# Patient Record
Sex: Female | Born: 1950 | Race: White | Hispanic: No | Marital: Single | Smoking: Current every day smoker
Health system: Southern US, Community
[De-identification: ages and names within clinical notes are randomized; demographics above are authoritative.]

## PROBLEM LIST (undated history)

## (undated) DIAGNOSIS — I10 Essential (primary) hypertension: Secondary | ICD-10-CM

## (undated) DIAGNOSIS — E7800 Pure hypercholesterolemia, unspecified: Secondary | ICD-10-CM

## (undated) DIAGNOSIS — I499 Cardiac arrhythmia, unspecified: Secondary | ICD-10-CM

## (undated) HISTORY — PX: APPENDECTOMY: SHX54

## (undated) HISTORY — PX: TUBAL LIGATION: SHX77

---

## 1999-05-25 ENCOUNTER — Other Ambulatory Visit: Admission: RE | Admit: 1999-05-25 | Discharge: 1999-05-25 | Payer: Self-pay | Admitting: Obstetrics and Gynecology

## 2000-09-20 ENCOUNTER — Other Ambulatory Visit: Admission: RE | Admit: 2000-09-20 | Discharge: 2000-09-20 | Payer: Self-pay | Admitting: Obstetrics and Gynecology

## 2001-11-05 ENCOUNTER — Other Ambulatory Visit: Admission: RE | Admit: 2001-11-05 | Discharge: 2001-11-05 | Payer: Self-pay | Admitting: Obstetrics and Gynecology

## 2004-12-05 ENCOUNTER — Other Ambulatory Visit: Admission: RE | Admit: 2004-12-05 | Discharge: 2004-12-05 | Payer: Self-pay | Admitting: Obstetrics and Gynecology

## 2013-04-25 DIAGNOSIS — M5137 Other intervertebral disc degeneration, lumbosacral region: Secondary | ICD-10-CM | POA: Insufficient documentation

## 2013-04-25 DIAGNOSIS — I10 Essential (primary) hypertension: Secondary | ICD-10-CM | POA: Insufficient documentation

## 2014-03-09 DIAGNOSIS — E785 Hyperlipidemia, unspecified: Secondary | ICD-10-CM | POA: Insufficient documentation

## 2014-03-29 ENCOUNTER — Emergency Department (HOSPITAL_COMMUNITY)
Admission: EM | Admit: 2014-03-29 | Discharge: 2014-03-29 | Disposition: A | Discharge status: Home / Self Care | Payer: Commercial Managed Care - PPO | Attending: Emergency Medicine | Admitting: Emergency Medicine

## 2014-03-29 ENCOUNTER — Encounter (HOSPITAL_COMMUNITY): Payer: Self-pay | Admitting: Emergency Medicine

## 2014-03-29 ENCOUNTER — Emergency Department (HOSPITAL_COMMUNITY): Payer: Commercial Managed Care - PPO

## 2014-03-29 DIAGNOSIS — R5381 Other malaise: Secondary | ICD-10-CM | POA: Insufficient documentation

## 2014-03-29 DIAGNOSIS — F17200 Nicotine dependence, unspecified, uncomplicated: Secondary | ICD-10-CM | POA: Insufficient documentation

## 2014-03-29 DIAGNOSIS — I472 Ventricular tachycardia, unspecified: Secondary | ICD-10-CM | POA: Insufficient documentation

## 2014-03-29 DIAGNOSIS — I10 Essential (primary) hypertension: Secondary | ICD-10-CM | POA: Insufficient documentation

## 2014-03-29 DIAGNOSIS — R51 Headache: Secondary | ICD-10-CM | POA: Insufficient documentation

## 2014-03-29 DIAGNOSIS — I4729 Other ventricular tachycardia: Secondary | ICD-10-CM | POA: Insufficient documentation

## 2014-03-29 DIAGNOSIS — R0602 Shortness of breath: Secondary | ICD-10-CM | POA: Insufficient documentation

## 2014-03-29 DIAGNOSIS — R5383 Other fatigue: Secondary | ICD-10-CM

## 2014-03-29 DIAGNOSIS — H539 Unspecified visual disturbance: Secondary | ICD-10-CM | POA: Insufficient documentation

## 2014-03-29 DIAGNOSIS — I471 Supraventricular tachycardia: Secondary | ICD-10-CM

## 2014-03-29 HISTORY — DX: Essential (primary) hypertension: I10

## 2014-03-29 HISTORY — DX: Pure hypercholesterolemia, unspecified: E78.00

## 2014-03-29 LAB — BASIC METABOLIC PANEL
BUN: 20 mg/dL (ref 6–23)
CO2: 21 meq/L (ref 19–32)
Calcium: 10.2 mg/dL (ref 8.4–10.5)
Chloride: 92 mEq/L — ABNORMAL LOW (ref 96–112)
Creatinine, Ser: 1.16 mg/dL — ABNORMAL HIGH (ref 0.50–1.10)
GFR calc Af Amer: 58 mL/min — ABNORMAL LOW (ref >90)
GFR, EST NON AFRICAN AMERICAN: 50 mL/min — AB (ref >90)
Glucose, Bld: 241 mg/dL — ABNORMAL HIGH (ref 70–99)
Potassium: 3.8 mEq/L (ref 3.7–5.3)
SODIUM: 130 meq/L — AB (ref 137–147)

## 2014-03-29 LAB — I-STAT TROPONIN, ED: Troponin i, poc: 0 ng/mL (ref 0.00–0.08)

## 2014-03-29 LAB — CBC
HCT: 43.4 % (ref 36.0–46.0)
HEMOGLOBIN: 15.6 g/dL — AB (ref 12.0–15.0)
MCH: 31.9 pg (ref 26.0–34.0)
MCHC: 35.9 g/dL (ref 30.0–36.0)
MCV: 88.8 fL (ref 78.0–100.0)
Platelets: 209 10*3/uL (ref 150–400)
RBC: 4.89 MIL/uL (ref 3.87–5.11)
RDW: 12 % (ref 11.5–15.5)
WBC: 12 10*3/uL — ABNORMAL HIGH (ref 4.0–10.5)

## 2014-03-29 MED ORDER — METOPROLOL TARTRATE 25 MG PO TABS: 25.0000 mg | ORAL_TABLET | Freq: Two times a day (BID) | ORAL | Status: DC

## 2014-03-29 MED ORDER — ADENOSINE 6 MG/2ML IV SOLN
INTRAVENOUS | Status: AC
  Administered 2014-03-29: 6 mg
  Filled 2014-03-29: qty 6

## 2014-03-29 MED ORDER — SODIUM CHLORIDE 0.9 % IV BOLUS (SEPSIS)
1000.0000 mL | Freq: Once | INTRAVENOUS | Status: AC
  Administered 2014-03-29: 1000 mL via INTRAVENOUS

## 2014-03-29 NOTE — ED Provider Notes (Signed)
CSN: [id number]     Arrival date & time 03/29/14  1701 History   First MD Initiated Contact with Patient 03/29/14 1736     Chief Complaint  Patient presents with  . Dizziness  . Shortness of Breath  . Headache     (Consider location/radiation/quality/duration/timing/severity/associated sxs/prior Treatment) HPI 63 year old female presents with about 5 hours of dizziness and weakness. She states this dizziness is similar to a lightheaded feeling. She has felt this way several times in the past for over a year but it does not usually last this long. She denies any palpitations, chest pain, or shortness of breath. She states today her vision has been intermittently blurry sometimes seeing floaters. She denies a feeling currently. She has a history of hypertension and hypercholesterolemia. She's not yet started her cholesterol medicine. She is currently on an ACE and HCTZ for her blood pressure. She denies any history of SVT. Patient states that she thinks she had some sort of heart problem over 20 years ago and had a procedure in her wrist to look at her arteries. However states her primary care physician did this and not a cardiologist. She denies a history of ablations or heart attacks.  Past Medical History  Diagnosis Date  . Hypertension   . High cholesterol    Past Surgical History  Procedure Laterality Date  . Appendectomy    . Tubal ligation     History reviewed. No pertinent family history. History  Substance Use Topics  . Smoking status: Current Every Day Smoker  . Smokeless tobacco: Not on file  . Alcohol Use: Yes   OB History   Grav Para Term Preterm Abortions TAB SAB Ect Mult Living                 Review of Systems  Eyes: Positive for visual disturbance.  Respiratory: Negative for shortness of breath.   Cardiovascular: Negative for chest pain, palpitations and leg swelling.  Gastrointestinal: Negative for vomiting.  Genitourinary: Negative for dysuria.   Neurological: Positive for dizziness, weakness and light-headedness. Negative for headaches.  All other systems reviewed and are negative.     Allergies  Sulfa antibiotics  Home Medications   Prior to Admission medications   Not on File   BP 128/101  Pulse 175  Temp(Src) 97.5 F (36.4 C) (Oral)  Resp 20  SpO2 96% Physical Exam  Nursing note and vitals reviewed. Constitutional: She is oriented to person, place, and time. She appears well-developed and well-nourished. No distress.  HENT:  Head: Normocephalic and atraumatic.  Right Ear: External ear normal.  Left Ear: External ear normal.  Nose: Nose normal.  Eyes: Right eye exhibits no discharge. Left eye exhibits no discharge.  Cardiovascular: Regular rhythm and normal heart sounds.  Tachycardia present.   Pulmonary/Chest: Effort normal and breath sounds normal.  Abdominal: Soft. She exhibits no distension. There is no tenderness.  Neurological: She is alert and oriented to person, place, and time.  Skin: Skin is warm and dry.    ED Course  CARDIOVERSION Date/Time: 03/29/2014 5:50 PM Performed by: Ephraim Hamburger Authorized by: Sherwood Gambler T Consent: Verbal consent obtained. Risks and benefits: risks, benefits and alternatives were discussed Consent given by: patient Patient sedated: no Cardioversion basis: emergent Pre-procedure rhythm: supraventricular tachycardia Patient position: patient was placed in a supine position Chest area: chest area exposed Number of attempts: 1 Post-procedure rhythm: sinus tachycardia. Complications: no complications Patient tolerance: Patient tolerated the procedure well with no immediate complications. Comments:  Converted to sinus tachycardia after 6 mg adenosine   (including critical care time) Labs Review Labs Reviewed  CBC - Abnormal; Notable for the following:    WBC 12.0 (*)    Hemoglobin 15.6 (*)    All other components within normal limits  BASIC METABOLIC  PANEL - Abnormal; Notable for the following:    Sodium 130 (*)    Chloride 92 (*)    Glucose, Bld 241 (*)    Creatinine, Ser 1.16 (*)    GFR calc non Af Amer 50 (*)    GFR calc Af Amer 58 (*)    All other components within normal limits  I-STAT TROPOININ, ED    Imaging Review Dg Chest Portable 1 View  03/29/2014   CLINICAL DATA:  Hypertension and history of tobacco use  EXAM: PORTABLE CHEST - 1 VIEW  COMPARISON:  None.  FINDINGS: The heart size and mediastinal contours are within normal limits. Both lungs are clear. The visualized skeletal structures are unremarkable.  IMPRESSION: No active disease.   Electronically Signed   By: Inez Catalina M.D.   On: 03/29/2014 18:34     EKG Interpretation   Date/Time:  Monday Mar 29 2014 17:17:26 EDT Ventricular Rate:  172 PR Interval:    QRS Duration: 90 QT Interval:  268 QTC Calculation: 453 R Axis:   66 Text Interpretation:  Supraventricular tachycardia Otherwise normal ECG No  old tracing to compare Confirmed by Malike Foglio  MD, Cornelis Kluver ([id number]) on  03/29/2014 5:26:22 PM      EKG #2  Date: 03/29/2014  Rate: 101  Rhythm: sinus tachycardia  QRS Axis: normal  Intervals: normal  ST/T Wave abnormalities: normal  Conduction Disutrbances:none  Narrative Interpretation: SVT resolved  Old EKG Reviewed: changes noted   MDM   Final diagnoses:  SVT (supraventricular tachycardia)    Patient feels completely normal after given adenosine. Her heart rate returned to normal and her rhythm converted to sinus. She's likely been SVT intermittently for several months given her symptoms. I discussed her case with the cardiologist on call, who recommends metoprolol 25 mg twice a day for rate control. He will help arrange for outpatient followup with a cardiologist. Patient not having any ischemic symptoms or any ischemia seen on EKG. This I feel she is safe for discharge, and counselor return precautions for both recurrent SVT and symptomatic adverse  reactions to the beta blocker.    Ephraim Hamburger, MD 03/29/14 [phone]

## 2014-03-29 NOTE — ED Notes (Signed)
Pt converted to sinus tachycardia rate of 107

## 2014-03-29 NOTE — ED Notes (Signed)
zoll pads in place.

## 2014-03-29 NOTE — ED Notes (Addendum)
Pt reports dizziness onset 2 hours ago. Pt with hx of same in past and given medication to get heart back in rhythm. Pt also complains of headache on and off when she has these spells. Headache centered to left side with blurred vision L>R.

## 2014-03-29 NOTE — ED Notes (Signed)
Per pt sts earlier today she began having dizziness, HA, and SOB. Pt in SVT at triage. Denies any chest pain.

## 2014-04-08 DIAGNOSIS — I471 Supraventricular tachycardia: Secondary | ICD-10-CM | POA: Insufficient documentation

## 2014-07-09 DIAGNOSIS — Z8639 Personal history of other endocrine, nutritional and metabolic disease: Secondary | ICD-10-CM | POA: Insufficient documentation

## 2014-07-09 DIAGNOSIS — E039 Hypothyroidism, unspecified: Secondary | ICD-10-CM | POA: Insufficient documentation

## 2014-09-15 DIAGNOSIS — M5416 Radiculopathy, lumbar region: Secondary | ICD-10-CM | POA: Insufficient documentation

## 2015-05-11 DIAGNOSIS — R631 Polydipsia: Secondary | ICD-10-CM | POA: Insufficient documentation

## 2015-05-11 DIAGNOSIS — R42 Dizziness and giddiness: Secondary | ICD-10-CM | POA: Insufficient documentation

## 2015-05-19 ENCOUNTER — Other Ambulatory Visit: Payer: Self-pay | Admitting: Family Medicine

## 2015-05-19 DIAGNOSIS — Z1231 Encounter for screening mammogram for malignant neoplasm of breast: Secondary | ICD-10-CM

## 2015-06-07 ENCOUNTER — Ambulatory Visit: Payer: Commercial Managed Care - PPO

## 2020-01-27 ENCOUNTER — Other Ambulatory Visit: Payer: Self-pay

## 2020-01-27 ENCOUNTER — Ambulatory Visit
Admission: EM | Admit: 2020-01-27 | Discharge: 2020-01-27 | Disposition: A | Discharge status: Home / Self Care | Payer: Medicare HMO | Attending: Internal Medicine | Admitting: Internal Medicine

## 2020-01-27 ENCOUNTER — Encounter: Payer: Self-pay | Admitting: Emergency Medicine

## 2020-01-27 DIAGNOSIS — M545 Low back pain, unspecified: Secondary | ICD-10-CM

## 2020-01-27 DIAGNOSIS — I10 Essential (primary) hypertension: Secondary | ICD-10-CM | POA: Diagnosis not present

## 2020-01-27 MED ORDER — METHYLPREDNISOLONE ACETATE 80 MG/ML IJ SUSP
80.0000 mg | Freq: Once | INTRAMUSCULAR | Status: AC
  Administered 2020-01-27: 80 mg via INTRAMUSCULAR

## 2020-01-27 MED ORDER — CYCLOBENZAPRINE HCL 5 MG PO TABS: 5.0000 mg | ORAL_TABLET | Freq: Two times a day (BID) | ORAL | 0 refills | Status: DC | PRN

## 2020-01-27 MED ORDER — DICLOFENAC SODIUM 50 MG PO TBEC: 50.0000 mg | DELAYED_RELEASE_TABLET | Freq: Two times a day (BID) | ORAL | 0 refills | Status: DC

## 2020-01-27 NOTE — Discharge Instructions (Signed)
We gave you an injection of Depo-Medrol today Continue with diclofenac twice daily, take with food no You may use flexeril as needed to help with pain. This is a muscle relaxer and causes sedation- please use only at bedtime or when you will be home and not have to drive/work Gentle stretching, avoid heavy lifting, avoid complete bedrest  Follow-up if not improving, worsening, developing any urinary symptoms or leg weakness

## 2020-01-27 NOTE — ED Notes (Signed)
Patient able to ambulate independently  

## 2020-01-27 NOTE — ED Provider Notes (Signed)
[facility]    CSN: [id number] Arrival date & time: 01/27/20  1625      History   Chief Complaint Chief Complaint  Patient presents with  . Back Pain    HPI Annette Kelley is a 69 y.o. female history of hypertension presenting today for evaluation of back pain.  Patient states that beginning Saturday she began to develop discomfort in her left lower back.  Worsened Sunday while she was on the bathroom straining and cough.  Pain has been spasming and worsening at times.  Does report some mild discomfort going into left leg.  Denies numbness or tingling.  Denies leg weakness.  Denies any urinary symptoms of dysuria, increased frequency or urgency.  Denies hematuria.  Denies history of kidney stones.  Denies any urinary incontinence.  Took some Tylenol approximately 30 minutes ago for symptoms.  Feels similar to prior back pain.  Denies any injury fall or trauma triggering symptoms.  Denies history of diabetes. HPI  Past Medical History:  Diagnosis Date  . High cholesterol   . Hypertension     There are no problems to display for this patient.   Past Surgical History:  Procedure Laterality Date  . APPENDECTOMY    . TUBAL LIGATION      OB History   No obstetric history on file.      Home Medications    Prior to Admission medications   Medication Sig Start Date End Date Taking? Authorizing Provider  aspirin EC 81 MG tablet Take 81 mg by mouth daily.    [provider]  cyclobenzaprine (FLEXERIL) 5 MG tablet Take 1-2 tablets (5-10 mg total) by mouth 2 (two) times daily as needed for muscle spasms. 01/27/20   Breahna Boylen C, PA-C  diclofenac (VOLTAREN) 50 MG EC tablet Take 1 tablet (50 mg total) by mouth 2 (two) times daily. 01/27/20   Jlyn Bracamonte C, PA-C  metoprolol (LOPRESSOR) 25 MG tablet Take 1 tablet (25 mg total) by mouth 2 (two) times daily. 03/29/14   Sherwood Gambler, MD  olmesartan-hydrochlorothiazide (BENICAR HCT) 40-25 MG per tablet Take  1 tablet by mouth daily.    [provider]    Family History Family History  Problem Relation Age of Onset  . Diabetes Mother   . Cancer Mother   . Hypertension Mother     Social History Social History   Tobacco Use  . Smoking status: Current Every Day Smoker    Packs/day: 1.00  . Smokeless tobacco: Never Used  Substance Use Topics  . Alcohol use: Yes  . Drug use: Not on file     Allergies   Sulfa antibiotics   Review of Systems Review of Systems  Constitutional: Negative for fatigue and fever.  Eyes: Negative for visual disturbance.  Respiratory: Negative for shortness of breath.   Cardiovascular: Negative for chest pain.  Gastrointestinal: Negative for abdominal pain, nausea and vomiting.  Genitourinary: Negative for decreased urine volume and difficulty urinating.  Musculoskeletal: Positive for back pain and myalgias. Negative for arthralgias and joint swelling.  Skin: Negative for color change, rash and wound.  Neurological: Negative for dizziness, weakness, light-headedness and headaches.     Physical Exam Triage Vital Signs ED Triage Vitals [01/27/20 1633]  Enc Vitals Group     BP (!) 179/78     Pulse Rate 86     Resp 18     Temp 98.2 F (36.8 C)     Temp Source Oral  SpO2 96 %     Weight      Height      Head Circumference      Peak Flow      Pain Score 10     Pain Loc      Pain Edu?      Excl. in [place]?    No data found.  Updated Vital Signs BP (!) 179/78 (BP Location: Left Arm)   Pulse 86   Temp 98.2 F (36.8 C) (Oral)   Resp 18   SpO2 96%   Visual Acuity Right Eye Distance:   Left Eye Distance:   Bilateral Distance:    Right Eye Near:   Left Eye Near:    Bilateral Near:     Physical Exam Vitals and nursing note reviewed.  Constitutional:      Appearance: She is well-developed.     Comments: No acute distress  HENT:     Head: Normocephalic and atraumatic.     Nose: Nose normal.  Eyes:     Conjunctiva/sclera:  Conjunctivae normal.  Cardiovascular:     Rate and Rhythm: Normal rate.  Pulmonary:     Effort: Pulmonary effort is normal. No respiratory distress.  Abdominal:     General: There is no distension.  Musculoskeletal:        General: Normal range of motion.     Cervical back: Neck supple.     Comments: Back: Nontender to palpation of thoracic, lumbar spine midline, no significant reproducible tenderness to palpation of left lower back, but points to this area as source of pain, ocassaionlly appears to have spasming/tightening of area  Hip strength 5/5 ankle bilaterally, patellar reflex 2+    Skin:    General: Skin is warm and dry.  Neurological:     Mental Status: She is alert and oriented to person, place, and time.      UC Treatments / Results  Labs (all labs ordered are listed, but only abnormal results are displayed) Labs Reviewed - No data to display  EKG   Radiology No results found.  Procedures Procedures (including critical care time)  Medications Ordered in UC Medications  methylPREDNISolone acetate (DEPO-MEDROL) injection 80 mg (has no administration in time range)    Initial Impression / Assessment and Plan / UC Course  I have reviewed the triage vital signs and the nursing notes.  Pertinent labs & imaging results that were available during my care of the patient were reviewed by me and considered in my medical decision making (see chart for details).     No mechanism of injury, do not suspect acute bony abnormality, no leg weakness, no red flags for cauda equina.  No urinary symptoms.  Most likely MSK cause.  Providing Depo-Medrol IM in clinic today, will continue on NSAIDs outpatient with diclofenac, Flexeril to help with spasming.  Discussed activity modification.  Discussed strict return precautions. Patient verbalized understanding and is agreeable with plan.  Final Clinical Impressions(s) / UC Diagnoses   Final diagnoses:  Acute left-sided low  back pain without sciatica     Discharge Instructions     We gave you an injection of Depo-Medrol today Continue with diclofenac twice daily, take with food no You may use flexeril as needed to help with pain. This is a muscle relaxer and causes sedation- please use only at bedtime or when you will be home and not have to drive/work Gentle stretching, avoid heavy lifting, avoid complete bedrest  Follow-up if not improving,  worsening, developing any urinary symptoms or leg weakness   ED Prescriptions    Medication Sig Dispense Auth. Provider   cyclobenzaprine (FLEXERIL) 5 MG tablet Take 1-2 tablets (5-10 mg total) by mouth 2 (two) times daily as needed for muscle spasms. 24 tablet Nakeya Adinolfi C, PA-C   diclofenac (VOLTAREN) 50 MG EC tablet Take 1 tablet (50 mg total) by mouth 2 (two) times daily. 30 tablet Toia Micale, [place] C, PA-C     PDMP not reviewed this encounter.   Janith Lima, PA-C 01/27/20 1704

## 2020-01-27 NOTE — ED Triage Notes (Signed)
Pt presents to [facility] for assessment of 3 days of left back pain (upper, mid and lower) without radiation to the leg.  Patient states hx of chronic back pain, and it was starting to bother her Saturday.  Sunday she went to have a bowel movement, coughed while sitting on the toilet, and the pain became much more difficult to bear.

## 2020-06-09 ENCOUNTER — Other Ambulatory Visit: Payer: Self-pay | Admitting: Family Medicine

## 2020-06-09 ENCOUNTER — Ambulatory Visit
Admission: RE | Admit: 2020-06-09 | Discharge: 2020-06-09 | Disposition: A | Discharge status: Home / Self Care | Payer: Medicare Other | Source: Ambulatory Visit | Attending: Family Medicine | Admitting: Family Medicine

## 2020-06-09 DIAGNOSIS — M25562 Pain in left knee: Secondary | ICD-10-CM

## 2020-08-05 DIAGNOSIS — E871 Hypo-osmolality and hyponatremia: Secondary | ICD-10-CM | POA: Insufficient documentation

## 2020-09-07 LAB — COLOGUARD: COLOGUARD: NEGATIVE

## 2021-03-30 NOTE — Progress Notes (Deleted)
Cardiology Office Note:    Date:  03/30/2021   ID:  Annette Kelley, DOB December 11, 1950, MRN [id number]  PCP:  Bartholome Bill, MD  Cardiologist:  None  Electrophysiologist:  None   Referring MD: Bartholome Bill, MD   No chief complaint on file. ***  History of Present Illness:    Annette Kelley is a 70 y.o. female with a hx of atrial fibrillation, hypertension, hyperlipidemia who is referred by Dr. Luciana Axe for evaluation of atrial fibrillation.  She was seen by Dr. Luciana Axe on 02/22/2021.  Found to be in new atrial fibrillation, rate 100s.  Past Medical History:  Diagnosis Date  . High cholesterol   . Hypertension     Past Surgical History:  Procedure Laterality Date  . APPENDECTOMY    . TUBAL LIGATION      Current Medications: No outpatient medications have been marked as taking for the 03/31/21 encounter (Appointment) with Donato Heinz, MD.     Allergies:   Sulfa antibiotics   Social History   Socioeconomic History  . Marital status: Single    Spouse name: Not on file  . Number of children: Not on file  . Years of education: Not on file  . Highest education level: Not on file  Occupational History  . Not on file  Tobacco Use  . Smoking status: Current Every Day Smoker    Packs/day: 1.00  . Smokeless tobacco: Never Used  Substance and Sexual Activity  . Alcohol use: Yes  . Drug use: Not on file  . Sexual activity: Not on file  Other Topics Concern  . Not on file  Social History Narrative  . Not on file   Social Determinants of Health   Financial Resource Strain: Not on file  Food Insecurity: Not on file  Transportation Needs: Not on file  Physical Activity: Not on file  Stress: Not on file  Social Connections: Not on file     Family History: The patient's ***family history includes Cancer in her mother; Diabetes in her mother; Hypertension in her mother.  ROS:   Please see the history of present illness.    *** All other systems  reviewed and are negative.  EKGs/Labs/Other Studies Reviewed:    The following studies were reviewed today: ***  EKG:  EKG is *** ordered today.  The ekg ordered today demonstrates ***  Recent Labs: No results found for requested labs within last 8760 hours.  Recent Lipid Panel No results found for: CHOL, TRIG, HDL, CHOLHDL, VLDL, LDLCALC, LDLDIRECT  Physical Exam:    VS:  There were no vitals taken for this visit.    Wt Readings from Last 3 Encounters:  No data found for Wt     GEN: *** Well nourished, well developed in no acute distress HEENT: Normal NECK: No JVD; No carotid bruits LYMPHATICS: No lymphadenopathy CARDIAC: ***RRR, no murmurs, rubs, gallops RESPIRATORY:  Clear to auscultation without rales, wheezing or rhonchi  ABDOMEN: Soft, non-tender, non-distended MUSCULOSKELETAL:  No edema; No deformity  SKIN: Warm and dry NEUROLOGIC:  Alert and oriented x 3 PSYCHIATRIC:  Normal affect   ASSESSMENT:    No diagnosis found. PLAN:    Atrial fibrillation:  Hypertension: On hydrochlorothiazide 25 mg daily, Toprol XL 25 mg daily, telmisartan 80 mg daily  Hyperlipidemia: On atorvastatin 20 mg daily  RTC in***  Medication Adjustments/Labs and Tests Ordered: Current medicines are reviewed at length with the patient today.  Concerns regarding medicines are outlined above.  No orders of the defined types were placed in this encounter.  No orders of the defined types were placed in this encounter.   There are no Patient Instructions on file for this visit.   Signed, Donato Heinz, MD  03/30/2021 10:19 PM    [facility]

## 2021-03-31 ENCOUNTER — Ambulatory Visit: Payer: Medicare Other | Admitting: Cardiology

## 2021-03-31 ENCOUNTER — Encounter: Payer: Self-pay | Admitting: Cardiology

## 2021-03-31 ENCOUNTER — Other Ambulatory Visit: Payer: Self-pay

## 2021-03-31 VITALS — BP 144/92 | HR 99 | Ht 63.0 in | Wt 144.0 lb

## 2021-03-31 DIAGNOSIS — E785 Hyperlipidemia, unspecified: Secondary | ICD-10-CM | POA: Diagnosis not present

## 2021-03-31 DIAGNOSIS — I10 Essential (primary) hypertension: Secondary | ICD-10-CM

## 2021-03-31 DIAGNOSIS — I4891 Unspecified atrial fibrillation: Secondary | ICD-10-CM

## 2021-03-31 DIAGNOSIS — R0683 Snoring: Secondary | ICD-10-CM | POA: Diagnosis not present

## 2021-03-31 MED ORDER — ELIQUIS 5 MG PO TABS
5.0000 mg | ORAL_TABLET | Freq: Two times a day (BID) | ORAL | 0 refills | Status: DC
Start: 2021-03-31 — End: 2021-05-08

## 2021-03-31 NOTE — Progress Notes (Signed)
Cardiology Office Note:    Date:  03/31/2021   ID:  Annette Kelley, DOB 1951-08-09, MRN [id number]  PCP:  Bartholome Bill, MD  Cardiologist:  None  Electrophysiologist:  None   Referring MD: Bartholome Bill, MD   Chief Complaint  Patient presents with  . New Patient (Initial Visit)  . Atrial Fibrillation    New onset.    History of Present Illness:    Annette Kelley is a 70 y.o. female with a hx of atrial fibrillation, hypertension, hyperlipidemia who is referred by Dr. Luciana Axe for evaluation of atrial fibrillation.  She was seen by Dr. Luciana Axe on 02/22/2021.  Found to be in new atrial fibrillation, rate 100s.  Today, she is doing well overall, without new complaints at this time. Currently she has been taking 1 metoprolol in the morning, and was unaware she needed to take it twice a day.  For activity, she enjoys playing golf (ride and walk the course), and also cleans the house, and does not notice any exertional symptoms. However, she does not enjoy walking for formal exercise. When she is very fatigued she has been known to snore, but is relieved by propping up her head. She has not participated in a sleep study. She denies any chest pain, shortness of breath, or palpitations. No pre-syncope, syncope, or lightheadedness to note. Also has no lower extremity edema, orthopnea or PND. Typically she smokes 1 ppd. Previously, she quit smoking for a period of 12 years. At this time, she does not wish to quit smoking as it is a stress reliever for her. She confirms a history of hypertension. Two of her brothers have Afib. Her father had a cerebral stroke around 40-50 yo. Her mother had a stroke in her late 46s, and had a pacemaker. She is unsure if her parents had Afib.    Past Medical History:  Diagnosis Date  . High cholesterol   . Hypertension     Past Surgical History:  Procedure Laterality Date  . APPENDECTOMY    . TUBAL LIGATION      Current Medications: Current Meds   Medication Sig  . apixaban (ELIQUIS) 5 MG TABS tablet Take 1 tablet (5 mg total) by mouth 2 (two) times daily.  Marland Kitchen atorvastatin (LIPITOR) 20 MG tablet Take 1 tablet by mouth daily.  . cyclobenzaprine (FLEXERIL) 5 MG tablet Take 1-2 tablets (5-10 mg total) by mouth 2 (two) times daily as needed for muscle spasms.  . diclofenac (VOLTAREN) 50 MG EC tablet Take 1 tablet (50 mg total) by mouth 2 (two) times daily.  . meloxicam (MOBIC) 7.5 MG tablet Take 1 tablet by mouth daily.  . metoprolol (LOPRESSOR) 25 MG tablet Take 1 tablet (25 mg total) by mouth 2 (two) times daily.  Marland Kitchen olmesartan-hydrochlorothiazide (BENICAR HCT) 40-25 MG per tablet Take 1 tablet by mouth daily.  . [DISCONTINUED] aspirin EC 81 MG tablet Take 162 mg by mouth daily.     Allergies:   Sulfa antibiotics   Social History   Socioeconomic History  . Marital status: Single    Spouse name: Not on file  . Number of children: Not on file  . Years of education: Not on file  . Highest education level: Not on file  Occupational History  . Not on file  Tobacco Use  . Smoking status: Current Every Day Smoker    Packs/day: 1.00  . Smokeless tobacco: Never Used  Substance and Sexual Activity  . Alcohol use: Yes  .  Drug use: Not on file  . Sexual activity: Not on file  Other Topics Concern  . Not on file  Social History Narrative  . Not on file   Social Determinants of Health   Financial Resource Strain: Not on file  Food Insecurity: Not on file  Transportation Needs: Not on file  Physical Activity: Not on file  Stress: Not on file  Social Connections: Not on file     Family History: The patient's family history includes Cancer in her mother; Diabetes in her mother; Hypertension in her mother.  ROS:   Please see the history of present illness.    (+) Snores All other systems reviewed and are negative.  EKGs/Labs/Other Studies Reviewed:    The following studies were reviewed today:   EKG:   03/31/2021: Atrial  fibrillation, rate 99 bpm, No ST abnormalities  Recent Labs: No results found for requested labs within last 8760 hours.  Recent Lipid Panel No results found for: CHOL, TRIG, HDL, CHOLHDL, VLDL, LDLCALC, LDLDIRECT  Physical Exam:    VS:  BP (!) 144/92 (BP Location: Left Arm, Patient Position: Sitting, Cuff Size: Normal)   Pulse 99   Ht 5\' 3"  (1.6 m)   Wt 144 lb (65.3 kg)   BMI 25.51 kg/m     Wt Readings from Last 3 Encounters:  03/31/21 144 lb (65.3 kg)     GEN: Well nourished, well developed in no acute distress HEENT: Normal NECK: No JVD; No carotid bruits LYMPHATICS: No lymphadenopathy CARDIAC: Irregular rhythm, normal rate, no murmurs, rubs, gallops RESPIRATORY:  Clear to auscultation without rales, wheezing or rhonchi  ABDOMEN: Soft, non-tender, non-distended MUSCULOSKELETAL:  No edema; No deformity  SKIN: Warm and dry NEUROLOGIC:  Alert and oriented x 3 PSYCHIATRIC:  Normal affect   ASSESSMENT:    1. Atrial fibrillation, unspecified type ([place])   2. Essential hypertension   3. Hyperlipidemia, unspecified hyperlipidemia type   4. Snoring    PLAN:    Atrial fibrillation: New diagnosis.  CHA2DS2-VASc score 3 (hypertension, age, female). -Rates appear reasonably controlled but she is only taking Lopressor once daily.  Advised to take Lopressor 25 mg twice daily -Start Eliquis 5 mg twice daily.  Stop daily aspirin.  Labs at [facility] on 02/23/2021 showed hemoglobin 16.9, creatinine 0.9 -Echocardiogram -Sleep study -Schedule follow-up in 3 weeks.  If remains in A. fib, will schedule cardioversion  Hypertension: On hydrochlorothiazide 25 mg daily,  telmisartan 80 mg daily.  Only taking metoprolol once daily as above, will increase to twice daily  Hyperlipidemia: On atorvastatin 20 mg daily. LDL 70 on 02/23/2021  Snoring: will check sleep study given new A. fib as above  RTC in 3 weeks  Medication Adjustments/Labs and Tests Ordered: Current medicines are reviewed  at length with the patient today.  Concerns regarding medicines are outlined above.  Orders Placed This Encounter  Procedures  . EKG 12-Lead  . ECHOCARDIOGRAM COMPLETE  . Split night study   Meds ordered this encounter  Medications  . apixaban (ELIQUIS) 5 MG TABS tablet    Sig: Take 1 tablet (5 mg total) by mouth 2 (two) times daily.    Dispense:  60 tablet    Refill:  0    30 day free card    Patient Instructions  Medication Instructions:  STOP aspirin INCREASE metoprolol tartrate (Lopressor) to 25 mg two times daily START Eliquis 5 mg two times daily  *If you need a refill on your cardiac medications before your  next appointment, please call your pharmacy*  Testing/Procedures: Your physician has requested that you have an echocardiogram. Echocardiography is a painless test that uses sound waves to create images of your heart. It provides your doctor with information about the size and shape of your heart and how well your heart's chambers and valves are working. This procedure takes approximately one hour. There are no restrictions for this procedure.  This will be done at our [facility] location:  [place] has recommended that you have a sleep study. This test records several body functions during sleep, including: brain activity, eye movement, oxygen and carbon dioxide blood levels, heart rate and rhythm, breathing rate and rhythm, the flow of air through your mouth and nose, snoring, body muscle movements, and chest and belly movement.  Follow-Up: At [facility], you and your health needs are our priority.  As part of our continuing mission to provide you with exceptional heart care, we have created designated Provider Care Teams.  These Care Teams include your primary Cardiologist (physician) and Advanced Practice Providers (APPs -  Physician Assistants and Nurse Practitioners) who all work together to provide you with the care you  need, when you need it.  We recommend signing up for the patient portal called "MyChart".  Sign up information is provided on this After Visit Summary.  MyChart is used to connect with patients for Virtual Visits (Telemedicine).  Patients are able to view lab/test results, encounter notes, upcoming appointments, etc.  Non-urgent messages can be sent to your provider as well.   To learn more about what you can do with MyChart, go to [email or web address].    Your next appointment:   3 week(s)  The format for your next appointment:   In Person  Provider:   Oswaldo Milian, MD        [facility] Stumpf,acting as a scribe for Donato Heinz, MD.,have documented all relevant documentation on the behalf of Donato Heinz, MD,as directed by  Donato Heinz, MD while in the presence of Donato Heinz, MD.  I, Donato Heinz, MD, have reviewed all documentation for this visit. The documentation on 03/31/21 for the exam, diagnosis, procedures, and orders are all accurate and complete.   Signed, Donato Heinz, MD  03/31/2021 6:00 PM    [place]

## 2021-03-31 NOTE — Patient Instructions (Signed)
Medication Instructions:  STOP aspirin INCREASE metoprolol tartrate (Lopressor) to 25 mg two times daily START Eliquis 5 mg two times daily  *If you need a refill on your cardiac medications before your next appointment, please call your pharmacy*  Testing/Procedures: Your physician has requested that you have an echocardiogram. Echocardiography is a painless test that uses sound waves to create images of your heart. It provides your doctor with information about the size and shape of your heart and how well your heart's chambers and valves are working. This procedure takes approximately one hour. There are no restrictions for this procedure.  This will be done at our [facility] location:  [place] has recommended that you have a sleep study. This test records several body functions during sleep, including: brain activity, eye movement, oxygen and carbon dioxide blood levels, heart rate and rhythm, breathing rate and rhythm, the flow of air through your mouth and nose, snoring, body muscle movements, and chest and belly movement.  Follow-Up: At [facility], you and your health needs are our priority.  As part of our continuing mission to provide you with exceptional heart care, we have created designated Provider Care Teams.  These Care Teams include your primary Cardiologist (physician) and Advanced Practice Providers (APPs -  Physician Assistants and Nurse Practitioners) who all work together to provide you with the care you need, when you need it.  We recommend signing up for the patient portal called "MyChart".  Sign up information is provided on this After Visit Summary.  MyChart is used to connect with patients for Virtual Visits (Telemedicine).  Patients are able to view lab/test results, encounter notes, upcoming appointments, etc.  Non-urgent messages can be sent to your provider as well.   To learn more about what you can do with MyChart, go to  [email or web address].    Your next appointment:   3 week(s)  The format for your next appointment:   In Person  Provider:   Oswaldo Milian, MD

## 2021-04-25 ENCOUNTER — Other Ambulatory Visit: Payer: Self-pay

## 2021-04-25 ENCOUNTER — Ambulatory Visit (HOSPITAL_COMMUNITY): Payer: Medicare Other | Attending: Internal Medicine

## 2021-04-25 ENCOUNTER — Ambulatory Visit: Payer: Medicare Other | Admitting: Cardiology

## 2021-04-25 ENCOUNTER — Encounter: Payer: Self-pay | Admitting: Cardiology

## 2021-04-25 DIAGNOSIS — I4819 Other persistent atrial fibrillation: Secondary | ICD-10-CM

## 2021-04-25 DIAGNOSIS — I4891 Unspecified atrial fibrillation: Secondary | ICD-10-CM

## 2021-04-25 DIAGNOSIS — E785 Hyperlipidemia, unspecified: Secondary | ICD-10-CM | POA: Diagnosis not present

## 2021-04-25 DIAGNOSIS — R0683 Snoring: Secondary | ICD-10-CM | POA: Diagnosis not present

## 2021-04-25 DIAGNOSIS — I10 Essential (primary) hypertension: Secondary | ICD-10-CM

## 2021-04-25 LAB — ECHOCARDIOGRAM COMPLETE: S' Lateral: 2.5 cm

## 2021-04-25 NOTE — Patient Instructions (Addendum)
**  Please call when you get home to clarify correct medication**  You are scheduled for a Cardioversion on Wednesday 6/22 with Dr. Harrell Gave.  Please arrive at the [facility] (Main Entrance A) at [facility]: [address] [place], [place] [address] at 10:30 AM.  DIET: Nothing to eat or drink after midnight except a sip of water with medications (see medication instructions below)  FYI: For your safety, and to allow [place] to monitor your vital signs accurately during the surgery/procedure we request that   if you have artificial nails, gel coating, SNS etc. Please have those removed prior to your surgery/procedure. Not having the nail coverings /polish removed may result in cancellation or delay of your surgery/procedure.   Medication Instructions:  Continue your anticoagulant: Eliquis You will need to continue your anticoagulant after  your procedure until you are told by your  Provider that it is safe to stop  Labs: 1 week prior to procedure  (BMET, CBC)  Our in office lab hours are Monday-Friday 8:00-4:00, closed for lunch 12:45-1:45 pm.  No appointment needed.  You must have a responsible person to drive you home and stay in the waiting area during your procedure. Failure to do so could result in cancellation.  Bring your insurance cards.  *Special Note: Every effort is made to have your procedure done on time. Occasionally there are emergencies that occur at the hospital that may cause delays. Please be patient if a delay does occur.     FOLLOW UP: Wednesday, 7/20 at 12pm with Dr. Gardiner Rhyme

## 2021-04-25 NOTE — Progress Notes (Deleted)
Cardiology Office Note:    Date:  04/25/2021   ID:  Annette Kelley, DOB Jul 14, 1951, MRN [id number]  PCP:  Bartholome Bill, MD  Cardiologist:  None  Electrophysiologist:  None   Referring MD: Bartholome Bill, MD   No chief complaint on file.   History of Present Illness:    Annette Kelley is a 70 y.o. female with a hx of atrial fibrillation, hypertension, hyperlipidemia who presents for follow-up.  She was referred by Dr. Luciana Axe for evaluation of atrial fibrillation, initially seen on 03/31/2021.  She was seen by Dr. Luciana Axe on 02/22/2021.  Found to be in new atrial fibrillation, rate 100s.  Today, she is doing well overall, without new complaints at this time. Currently she has been taking 1 metoprolol in the morning, and was unaware she needed to take it twice a day.  For activity, she enjoys playing golf (ride and walk the course), and also cleans the house, and does not notice any exertional symptoms. However, she does not enjoy walking for formal exercise. When she is very fatigued she has been known to snore, but is relieved by propping up her head. She has not participated in a sleep study. She denies any chest pain, shortness of breath, or palpitations. No pre-syncope, syncope, or lightheadedness to note. Also has no lower extremity edema, orthopnea or PND. Typically she smokes 1 ppd. Previously, she quit smoking for a period of 12 years. At this time, she does not wish to quit smoking as it is a stress reliever for her. She confirms a history of hypertension. Two of her brothers have Afib. Her father had a cerebral stroke around 3-50 yo. Her mother had a stroke in her late 77s, and had a pacemaker. She is unsure if her parents had Afib.    Past Medical History:  Diagnosis Date  . High cholesterol   . Hypertension     Past Surgical History:  Procedure Laterality Date  . APPENDECTOMY    . TUBAL LIGATION      Current Medications: Current Meds  Medication Sig  . apixaban  (ELIQUIS) 5 MG TABS tablet Take 1 tablet (5 mg total) by mouth 2 (two) times daily.  Marland Kitchen atorvastatin (LIPITOR) 20 MG tablet Take 1 tablet by mouth daily.  . cyclobenzaprine (FLEXERIL) 5 MG tablet Take 1-2 tablets (5-10 mg total) by mouth 2 (two) times daily as needed for muscle spasms.  . diclofenac (VOLTAREN) 50 MG EC tablet Take 1 tablet (50 mg total) by mouth 2 (two) times daily.  Marland Kitchen losartan-hydrochlorothiazide (HYZAAR) 100-12.5 MG tablet Take 1 tablet by mouth daily.  . meloxicam (MOBIC) 7.5 MG tablet Take 1 tablet by mouth daily.  . metoprolol (LOPRESSOR) 25 MG tablet Take 1 tablet (25 mg total) by mouth 2 (two) times daily.  Marland Kitchen olmesartan-hydrochlorothiazide (BENICAR HCT) 40-25 MG per tablet Take 1 tablet by mouth daily.     Allergies:   Sulfa antibiotics   Social History   Socioeconomic History  . Marital status: Single    Spouse name: Not on file  . Number of children: Not on file  . Years of education: Not on file  . Highest education level: Not on file  Occupational History  . Not on file  Tobacco Use  . Smoking status: Current Every Day Smoker    Packs/day: 1.00  . Smokeless tobacco: Never Used  Substance and Sexual Activity  . Alcohol use: Yes  . Drug use: Not on file  . Sexual  activity: Not on file  Other Topics Concern  . Not on file  Social History Narrative  . Not on file   Social Determinants of Health   Financial Resource Strain: Not on file  Food Insecurity: Not on file  Transportation Needs: Not on file  Physical Activity: Not on file  Stress: Not on file  Social Connections: Not on file     Family History: The patient's family history includes Cancer in her mother; Diabetes in her mother; Hypertension in her mother.  ROS:   Please see the history of present illness.    (+) Snores All other systems reviewed and are negative.  EKGs/Labs/Other Studies Reviewed:    The following studies were reviewed today:   EKG:   03/31/2021: Atrial  fibrillation, rate 99 bpm, No ST abnormalities  Recent Labs: No results found for requested labs within last 8760 hours.  Recent Lipid Panel No results found for: CHOL, TRIG, HDL, CHOLHDL, VLDL, LDLCALC, LDLDIRECT  Physical Exam:    VS:  BP 132/80   Pulse 90   Ht 5\' 3"  (1.6 m)   Wt 140 lb (63.5 kg)   SpO2 99%   BMI 24.80 kg/m     Wt Readings from Last 3 Encounters:  04/25/21 140 lb (63.5 kg)  03/31/21 144 lb (65.3 kg)     GEN: Well nourished, well developed in no acute distress HEENT: Normal NECK: No JVD; No carotid bruits LYMPHATICS: No lymphadenopathy CARDIAC: Irregular rhythm, normal rate, no murmurs, rubs, gallops RESPIRATORY:  Clear to auscultation without rales, wheezing or rhonchi  ABDOMEN: Soft, non-tender, non-distended MUSCULOSKELETAL:  No edema; No deformity  SKIN: Warm and dry NEUROLOGIC:  Alert and oriented x 3 PSYCHIATRIC:  Normal affect   ASSESSMENT:    1. Persistent atrial fibrillation ([place])   2. Essential hypertension   3. Hyperlipidemia, unspecified hyperlipidemia type   4. Snoring    PLAN:    Persistent atrial fibrillation: New diagnosis.  CHA2DS2-VASc score 3 (hypertension, age, female). -Rates appear controlled, continue metoprolol 25 mg twice daily -Continue Eliquis 5 mg twice daily -Echocardiogram pending -Sleep study -Recommend cardioversion to restore sinus rhythm given new onset A. fib, denies any missed Eliquis doses in the last 3 weeks, will schedule cardioversion  Hypertension: On hydrochlorothiazide 25 mg daily and metoprolol 25 mg BID and thinks she is taking lisinopril but is not sure.  Asked her to check her meds and let us know what she is taking.  Hyperlipidemia: On atorvastatin 20 mg daily. LDL 70 on 02/23/2021  Snoring: will check sleep study given new A. fib as above  RTC in 6 weeks  Medication Adjustments/Labs and Tests Ordered: Current medicines are reviewed at length with the patient today.  Concerns regarding medicines  are outlined above.  No orders of the defined types were placed in this encounter.  No orders of the defined types were placed in this encounter.   There are no Patient Instructions on file for this visit.   I,Mathew Stumpf,acting as a Education administrator for Donato Heinz, MD.,have documented all relevant documentation on the behalf of Donato Heinz, MD,as directed by  Donato Heinz, MD while in the presence of Donato Heinz, MD.  I, Donato Heinz, MD, have reviewed all documentation for this visit. The documentation on 04/25/21 for the exam, diagnosis, procedures, and orders are all accurate and complete.   Signed, Donato Heinz, MD  04/25/2021 2:38 PM    [place]

## 2021-04-25 NOTE — Progress Notes (Addendum)
Cardiology Office Note:    Date:  04/25/2021   ID:  Annette Kelley, DOB [birth year], MRN [id number]  PCP:  Bartholome Bill, MD  Cardiologist:  None  Electrophysiologist:  None   Referring MD: Bartholome Bill, MD   Chief Complaint  Patient presents with   Atrial Fibrillation    History of Present Illness:    Annette Kelley is a 70 y.o. female with a hx of atrial fibrillation, hypertension, hyperlipidemia who presents for follow-up.  She was referred by Dr. Luciana Axe for evaluation of atrial fibrillation, initially seen on 03/31/2021.  She was seen by Dr. Luciana Axe on 02/22/2021.  Found to be in new atrial fibrillation, rate 100s.  At her previous visit, she was doing well overall, without new complaints at this time. Currently she has been taking 1 metoprolol in the morning, and was unaware she needed to take it twice a day.  For activity, she enjoys playing golf (ride and walk the course), and also cleans the house, and does not notice any exertional symptoms. However, she does not enjoy walking for formal exercise. When she is very fatigued she has been known to snore, but is relieved by propping up her head. She has not participated in a sleep study. She denies any chest pain, shortness of breath, or palpitations. No pre-syncope, syncope, or lightheadedness to note. Also has no lower extremity edema, orthopnea or PND. Typically she smokes 1 ppd. Previously, she quit smoking for a period of 12 years. At this time, she does not wish to quit smoking as it is a stress reliever for her. She confirms a history of hypertension. Two of her brothers have Afib. Her father had a cerebral stroke around 77-50 yo. Her mother had a stroke in her late 84s, and had a pacemaker. She is unsure if her parents had Afib.   Since last clinic visit, she has not had any chest pain or palpitations. She appears well. She remains compliant with Eliquis and Metoprolol and has not missed a single dose. In the past two weeks,  she only used meloxicam once. She denies any shortness of breath, or exertional symptoms. No headaches, lightheadedness, or syncope to report. Also has no lower extremity edema, orthopnea or PND. Recently, her brother passed away and she needs to defer an ablation for a few weeks.   Past Medical History:  Diagnosis Date   High cholesterol    Hypertension     Past Surgical History:  Procedure Laterality Date   APPENDECTOMY     TUBAL LIGATION      Current Medications: Current Meds  Medication Sig   apixaban (ELIQUIS) 5 MG TABS tablet Take 1 tablet (5 mg total) by mouth 2 (two) times daily.   atorvastatin (LIPITOR) 20 MG tablet Take 1 tablet by mouth daily.   cyclobenzaprine (FLEXERIL) 5 MG tablet Take 1-2 tablets (5-10 mg total) by mouth 2 (two) times daily as needed for muscle spasms.   diclofenac (VOLTAREN) 50 MG EC tablet Take 1 tablet (50 mg total) by mouth 2 (two) times daily.   losartan-hydrochlorothiazide (HYZAAR) 100-12.5 MG tablet Take 1 tablet by mouth daily.   meloxicam (MOBIC) 7.5 MG tablet Take 1 tablet by mouth daily.   metoprolol (LOPRESSOR) 25 MG tablet Take 1 tablet (25 mg total) by mouth 2 (two) times daily.   olmesartan-hydrochlorothiazide (BENICAR HCT) 40-25 MG per tablet Take 1 tablet by mouth daily.     Allergies:   Sulfa antibiotics   Social History  Socioeconomic History   Marital status: Single    Spouse name: Not on file   Number of children: Not on file   Years of education: Not on file   Highest education level: Not on file  Occupational History   Not on file  Tobacco Use   Smoking status: Current Every Day Smoker    Packs/day: 1.00   Smokeless tobacco: Never Used  Substance and Sexual Activity   Alcohol use: Yes   Drug use: Not on file   Sexual activity: Not on file  Other Topics Concern   Not on file  Social History Narrative   Not on file   Social Determinants of Health   Financial Resource Strain: Not on file  Food Insecurity: Not  on file  Transportation Needs: Not on file  Physical Activity: Not on file  Stress: Not on file  Social Connections: Not on file     Family History: The patient's family history includes Cancer in her mother; Diabetes in her mother; Hypertension in her mother.  ROS:   Please see the history of present illness.     All other systems reviewed and are negative.  EKGs/Labs/Other Studies Reviewed:    The following studies were reviewed today:   EKG:   04/25/2021: Atrial Fibrillation, rate 90, poor R wave progression 03/31/2021: Atrial fibrillation, rate 99 bpm, No ST abnormalities  Recent Labs: No results found for requested labs within last 8760 hours.  Recent Lipid Panel No results found for: CHOL, TRIG, HDL, CHOLHDL, VLDL, LDLCALC, LDLDIRECT  Physical Exam:    VS:  BP 132/80   Pulse 90   Ht 5\' 3"  (1.6 m)   Wt 140 lb (63.5 kg)   SpO2 99%   BMI 24.80 kg/m     Wt Readings from Last 3 Encounters:  04/25/21 140 lb (63.5 kg)  03/31/21 144 lb (65.3 kg)     GEN: Well nourished, well developed in no acute distress HEENT: Normal NECK: No JVD; No carotid bruits LYMPHATICS: No lymphadenopathy CARDIAC: Irregular rhythm, normal rate, no murmurs, rubs, gallops RESPIRATORY:  Clear to auscultation without rales, wheezing or rhonchi  ABDOMEN: Soft, non-tender, non-distended MUSCULOSKELETAL:  No edema; No deformity  SKIN: Warm and dry NEUROLOGIC:  Alert and oriented x 3 PSYCHIATRIC:  Normal affect   ASSESSMENT:    1. Persistent atrial fibrillation ([place])   2. Essential hypertension   3. Hyperlipidemia, unspecified hyperlipidemia type   4. Snoring    PLAN:    Persistent atrial fibrillation: New diagnosis.  CHA2DS2-VASc score 3 (hypertension, age, female). -Rates appear controlled, continue metoprolol 25 mg twice daily -Continue Eliquis 5 mg twice daily -Echocardiogram pending -Sleep study -Recommend cardioversion to restore sinus rhythm given new onset A. fib, denies any  missed Eliquis doses in the last 3 weeks, will schedule cardioversion   Hypertension: On hydrochlorothiazide 25 mg daily and telmisartan 80 mg daily.  Appears controlled.   Hyperlipidemia: On atorvastatin 20 mg daily. LDL 70 on 02/23/2021   Snoring: will check sleep study given new A. fib as above   RTC in 6 weeks   Medication Adjustments/Labs and Tests Ordered: Current medicines are reviewed at length with the patient today.  Concerns regarding medicines are outlined above.  Orders Placed This Encounter  Procedures   Basic metabolic panel   CBC   EKG 12-Lead   No orders of the defined types were placed in this encounter.   Patient Instructions  **Please call when you get home to clarify correct  medication**  You are scheduled for a Cardioversion on Wednesday 6/22 with Dr. Harrell Gave.  Please arrive at the [facility] (Main Entrance A) at [facility]: [address] [place], [place] [address] at 10:30 AM.  DIET: Nothing to eat or drink after midnight except a sip of water with medications (see medication instructions below)  FYI: For your safety, and to allow [place] to monitor your vital signs accurately during the surgery/procedure we request that   if you have artificial nails, gel coating, SNS etc. Please have those removed prior to your surgery/procedure. Not having the nail coverings /polish removed may result in cancellation or delay of your surgery/procedure.   Medication Instructions:  Continue your anticoagulant: Eliquis You will need to continue your anticoagulant after  your procedure until you are told by your  Provider that it is safe to stop  Labs: 1 week prior to procedure  (BMET, CBC)  Our in office lab hours are Monday-Friday 8:00-4:00, closed for lunch 12:45-1:45 pm.  No appointment needed.  You must have a responsible person to drive you home and stay in the waiting area during your procedure. Failure to do so could result in cancellation.  Bring  your insurance cards.  *Special Note: Every effort is made to have your procedure done on time. Occasionally there are emergencies that occur at the hospital that may cause delays. Please be patient if a delay does occur.     FOLLOW UP: Wednesday, 7/20 at 12pm with Dr. Bruna Potter [place] as a scribe for Donato Heinz, MD.,have documented all relevant documentation on the behalf of Donato Heinz, MD,as directed by  Donato Heinz, MD while in the presence of Donato Heinz, MD.  I, Donato Heinz, MD, have reviewed all documentation for this visit. The documentation on 04/25/21 for the exam, diagnosis, procedures, and orders are all accurate and complete.   Signed, Donato Heinz, MD  04/25/2021 3:05 PM    [facility]

## 2021-04-28 ENCOUNTER — Other Ambulatory Visit: Payer: Self-pay | Admitting: *Deleted

## 2021-05-04 LAB — BASIC METABOLIC PANEL
BUN/Creatinine Ratio: 20 (ref 12–28)
BUN: 16 mg/dL (ref 8–27)
CO2: 24 mmol/L (ref 20–29)
Calcium: 10 mg/dL (ref 8.7–10.3)
Chloride: 96 mmol/L (ref 96–106)
Creatinine, Ser: 0.82 mg/dL (ref 0.57–1.00)
Glucose: 120 mg/dL — ABNORMAL HIGH (ref 65–99)
Potassium: 4.2 mmol/L (ref 3.5–5.2)
Sodium: 136 mmol/L (ref 134–144)
eGFR: 78 mL/min/{1.73_m2} (ref >59)

## 2021-05-04 LAB — CBC
Hematocrit: 49.2 % — ABNORMAL HIGH (ref 34.0–46.6)
Hemoglobin: 16.8 g/dL — ABNORMAL HIGH (ref 11.1–15.9)
MCH: 32.2 pg (ref 26.6–33.0)
MCHC: 34.1 g/dL (ref 31.5–35.7)
MCV: 94 fL (ref 79–97)
RBC: 5.21 x10E6/uL (ref 3.77–5.28)
RDW: 12.2 % (ref 11.7–15.4)
WBC: 9 10*3/uL (ref 3.4–10.8)

## 2021-05-07 ENCOUNTER — Other Ambulatory Visit: Payer: Self-pay | Admitting: Cardiology

## 2021-05-08 ENCOUNTER — Telehealth: Payer: Self-pay | Admitting: Cardiology

## 2021-05-08 DIAGNOSIS — Z01812 Encounter for preprocedural laboratory examination: Secondary | ICD-10-CM

## 2021-05-08 DIAGNOSIS — I4819 Other persistent atrial fibrillation: Secondary | ICD-10-CM

## 2021-05-08 NOTE — Telephone Encounter (Signed)
Will need to reschedule cardioversion, has to complete 3 weeks of uninterrupted anticoagulation prior to cardioversion, can reschedule for 3 weeks from missed dose, after 7/9

## 2021-05-08 NOTE — Telephone Encounter (Signed)
Patient aware and verbalized understanding.    Will reschedule cardioversion for week for 7/11 and return call with time/date.

## 2021-05-08 NOTE — Telephone Encounter (Signed)
Prescription refill request for Eliquis received. Indication:atrial fib Last office visit:6/22 Scr:0.8 Age: 70 Weight:63.5 kg  Prescription refilled

## 2021-05-08 NOTE — Telephone Encounter (Signed)
Pt c/o medication issue:  1. Name of Medication: apixaban (ELIQUIS) 5 MG TABS tablet  2. How are you currently taking this medication (dosage and times per day)? As prescribed   3. Are you having a reaction (difficulty breathing--STAT)? No   4. What is your medication issue? Arnola is calling stating Dr. Judeth Cornfield office called and advised she call to inform Dr. Gardiner Rhyme that she missed her PM dose of Eliquis on Friday. Please advise.

## 2021-05-09 NOTE — Telephone Encounter (Signed)
Rescheduled cardioversion for Thursday 7/14 at 10 AM with Dr. Johnsie Cancel. Will need repeat labs  Case ID # [id number]    Left message for patient to call back

## 2021-05-09 NOTE — Telephone Encounter (Signed)
Spoke to patient, aware of time/date of cardioversion. Arrival at 9 AM Labs 1 week prior (orders placed).

## 2021-05-10 ENCOUNTER — Ambulatory Visit (HOSPITAL_COMMUNITY): Admit: 2021-05-10 | Payer: Medicare Other | Admitting: Cardiology

## 2021-05-10 ENCOUNTER — Encounter (HOSPITAL_COMMUNITY): Payer: Self-pay

## 2021-05-10 SURGERY — CARDIOVERSION
Anesthesia: General

## 2021-05-25 LAB — BASIC METABOLIC PANEL
BUN/Creatinine Ratio: 14 (ref 12–28)
BUN: 12 mg/dL (ref 8–27)
CO2: 18 mmol/L — ABNORMAL LOW (ref 20–29)
Calcium: 10.1 mg/dL (ref 8.7–10.3)
Chloride: 91 mmol/L — ABNORMAL LOW (ref 96–106)
Creatinine, Ser: 0.83 mg/dL (ref 0.57–1.00)
Glucose: 81 mg/dL (ref 65–99)
Potassium: 4 mmol/L (ref 3.5–5.2)
Sodium: 134 mmol/L (ref 134–144)
eGFR: 77 mL/min/{1.73_m2} (ref >59)

## 2021-05-25 LAB — CBC
Hematocrit: 52 % — ABNORMAL HIGH (ref 34.0–46.6)
Hemoglobin: 18.6 g/dL — ABNORMAL HIGH (ref 11.1–15.9)
MCH: 32.4 pg (ref 26.6–33.0)
MCHC: 35.8 g/dL — ABNORMAL HIGH (ref 31.5–35.7)
MCV: 91 fL (ref 79–97)
RBC: 5.74 x10E6/uL — ABNORMAL HIGH (ref 3.77–5.28)
RDW: 12.1 % (ref 11.7–15.4)
WBC: 8.5 10*3/uL (ref 3.4–10.8)

## 2021-05-31 ENCOUNTER — Other Ambulatory Visit: Payer: Self-pay | Admitting: *Deleted

## 2021-06-01 ENCOUNTER — Other Ambulatory Visit: Payer: Self-pay

## 2021-06-01 ENCOUNTER — Ambulatory Visit (HOSPITAL_COMMUNITY): Payer: Medicare Other | Admitting: Certified Registered Nurse Anesthetist

## 2021-06-01 ENCOUNTER — Encounter (HOSPITAL_COMMUNITY)
Admission: RE | Disposition: A | Discharge status: Home / Self Care | Payer: Self-pay | Source: Home / Self Care | Attending: Cardiovascular Disease

## 2021-06-01 ENCOUNTER — Encounter (HOSPITAL_COMMUNITY): Payer: Self-pay | Admitting: Cardiovascular Disease

## 2021-06-01 ENCOUNTER — Ambulatory Visit (HOSPITAL_COMMUNITY)
Admission: RE | Admit: 2021-06-01 | Discharge: 2021-06-01 | Disposition: A | Discharge status: Home / Self Care | Payer: Medicare Other | Attending: Cardiovascular Disease | Admitting: Cardiovascular Disease

## 2021-06-01 DIAGNOSIS — I4819 Other persistent atrial fibrillation: Secondary | ICD-10-CM | POA: Insufficient documentation

## 2021-06-01 DIAGNOSIS — E785 Hyperlipidemia, unspecified: Secondary | ICD-10-CM | POA: Insufficient documentation

## 2021-06-01 DIAGNOSIS — Z79899 Other long term (current) drug therapy: Secondary | ICD-10-CM | POA: Diagnosis not present

## 2021-06-01 DIAGNOSIS — I4891 Unspecified atrial fibrillation: Secondary | ICD-10-CM | POA: Diagnosis not present

## 2021-06-01 DIAGNOSIS — Z7901 Long term (current) use of anticoagulants: Secondary | ICD-10-CM | POA: Insufficient documentation

## 2021-06-01 DIAGNOSIS — Z882 Allergy status to sulfonamides status: Secondary | ICD-10-CM | POA: Insufficient documentation

## 2021-06-01 DIAGNOSIS — F17210 Nicotine dependence, cigarettes, uncomplicated: Secondary | ICD-10-CM | POA: Insufficient documentation

## 2021-06-01 DIAGNOSIS — I10 Essential (primary) hypertension: Secondary | ICD-10-CM | POA: Diagnosis not present

## 2021-06-01 HISTORY — PX: CARDIOVERSION: SHX1299

## 2021-06-01 SURGERY — CARDIOVERSION
Anesthesia: General

## 2021-06-01 MED ORDER — PROPOFOL 10 MG/ML IV BOLUS
INTRAVENOUS | Status: DC | PRN
  Administered 2021-06-01: 70 mg via INTRAVENOUS

## 2021-06-01 MED ORDER — LIDOCAINE 2% (20 MG/ML) 5 ML SYRINGE
INTRAMUSCULAR | Status: DC | PRN
  Administered 2021-06-01: 60 mg via INTRAVENOUS

## 2021-06-01 MED ORDER — SODIUM CHLORIDE 0.9 % IV SOLN: INTRAVENOUS | Status: DC

## 2021-06-01 NOTE — CV Procedure (Signed)
[place]: Anesthesia: Propofol On Rx anticoagulation   DCC x 1 120 J Converted to NSR rate 64 bpm  No immediate neurologic sequelae  Jenkins Rouge MD [facility]

## 2021-06-01 NOTE — Anesthesia Preprocedure Evaluation (Addendum)
Anesthesia Evaluation  Patient identified by MRN, date of birth, ID band Patient awake    Reviewed: Allergy & Precautions, NPO status , Patient's Chart, lab work & pertinent test results, reviewed documented beta blocker date and time   History of Anesthesia Complications Negative for: history of anesthetic complications  Airway Mallampati: III  TM Distance: >3 FB Neck ROM: Full    Dental  (+) Dental Advisory Given, Chipped   Pulmonary Current SmokerPatient did not abstain from smoking.,    Pulmonary exam normal        Cardiovascular hypertension, Pt. on medications and Pt. on home beta blockers + dysrhythmias Atrial Fibrillation + Valvular Problems/Murmurs AI  Rhythm:Irregular Rate:Normal   '22 TTE -  EF 55 to 60%. Trivial MR. Mild to moderate AI. Mild aortic valve sclerosis is present, with no evidence of aortic valve stenosis.    Neuro/Psych negative neurological ROS  negative psych ROS   GI/Hepatic negative GI ROS, Neg liver ROS,   Endo/Other  negative endocrine ROS  Renal/GU negative Renal ROS     Musculoskeletal negative musculoskeletal ROS (+)   Abdominal   Peds  Hematology  On eliquis    Anesthesia Other Findings   Reproductive/Obstetrics                            Anesthesia Physical Anesthesia Plan  ASA: 3  Anesthesia Plan: General   Post-op Pain Management:    Induction: Intravenous  PONV Risk Score and Plan: 2 and Treatment may vary due to age or medical condition and Propofol infusion  Airway Management Planned: Mask and Natural Airway  Additional Equipment: None  Intra-op Plan:   Post-operative Plan:   Informed Consent: I have reviewed the patients History and Physical, chart, labs and discussed the procedure including the risks, benefits and alternatives for the proposed anesthesia with the patient or authorized representative who has indicated his/her  understanding and acceptance.       Plan Discussed with: CRNA and Anesthesiologist  Anesthesia Plan Comments:        Anesthesia Quick Evaluation

## 2021-06-01 NOTE — H&P (Signed)
Cardiology Office Note:    Date:  06/01/2021   ID:  Annette Kelley, DOB 01-07-1951, MRN [id number]  PCP:  Bartholome Bill, MD  Cardiologist:  None  Electrophysiologist:  None   Referring MD: No ref. provider found   No chief complaint on file.   History of Present Illness:    Annette Kelley is a 70 y.o. female with a hx of atrial fibrillation, hypertension, hyperlipidemia who presents for follow-up.  She was referred by Dr. Luciana Axe for evaluation of atrial fibrillation, initially seen on 03/31/2021.  She was seen by Dr. Luciana Axe on 02/22/2021.  Found to be in new atrial fibrillation, rate 100s.  At her previous visit, she was doing well overall, without new complaints at this time. Currently she has been taking 1 metoprolol in the morning, and was unaware she needed to take it twice a day.  For activity, she enjoys playing golf (ride and walk the course), and also cleans the house, and does not notice any exertional symptoms. However, she does not enjoy walking for formal exercise. When she is very fatigued she has been known to snore, but is relieved by propping up her head. She has not participated in a sleep study. She denies any chest pain, shortness of breath, or palpitations. No pre-syncope, syncope, or lightheadedness to note. Also has no lower extremity edema, orthopnea or PND. Typically she smokes 1 ppd. Previously, she quit smoking for a period of 12 years. At this time, she does not wish to quit smoking as it is a stress reliever for her. She confirms a history of hypertension. Two of her brothers have Afib. Her father had a cerebral stroke around 79-50 yo. Her mother had a stroke in her late 80s, and had a pacemaker. She is unsure if her parents had Afib.   Since last clinic visit, she has not had any chest pain or palpitations. She appears well. She remains compliant with Eliquis and Metoprolol and has not missed a single dose. In the past two weeks, she only used meloxicam once. She  denies any shortness of breath, or exertional symptoms. No headaches, lightheadedness, or syncope to report. Also has no lower extremity edema, orthopnea or PND. Recently, her brother passed away and she needs to defer an ablation for a few weeks.  Here today for [facility] arranged by Dr Gardiner Rhyme   Past Medical History:  Diagnosis Date   High cholesterol    Hypertension     Past Surgical History:  Procedure Laterality Date   APPENDECTOMY     TUBAL LIGATION      Current Medications: Current Meds  Medication Sig   apixaban (ELIQUIS) 5 MG TABS tablet TAKE 1 TABLET BY MOUTH TWICE DAILY (Patient taking differently: Take 5 mg by mouth 2 (two) times daily.)   atorvastatin (LIPITOR) 20 MG tablet Take 20 mg by mouth daily.   cyclobenzaprine (FLEXERIL) 5 MG tablet Take 1-2 tablets (5-10 mg total) by mouth 2 (two) times daily as needed for muscle spasms.   diclofenac Sodium (VOLTAREN) 1 % GEL Apply 1 application topically daily as needed (pain).   hydrochlorothiazide (HYDRODIURIL) 25 MG tablet Take 25 mg by mouth every other day.   meloxicam (MOBIC) 7.5 MG tablet Take 7.5 mg by mouth daily as needed for pain.   metoprolol succinate (TOPROL-XL) 50 MG 24 hr tablet Take 50 mg by mouth daily. Take with or immediately following a meal.   telmisartan (MICARDIS) 80 MG tablet Take 80 mg by mouth  daily.     Allergies:   Other and Sulfa antibiotics   Social History   Socioeconomic History   Marital status: Single    Spouse name: Not on file   Number of children: Not on file   Years of education: Not on file   Highest education level: Not on file  Occupational History   Not on file  Tobacco Use   Smoking status: Every Day    Packs/day: 1.00    Types: Cigarettes   Smokeless tobacco: Never  Substance and Sexual Activity   Alcohol use: Yes    Alcohol/week: 2.0 standard drinks    Types: 2 Cans of beer per week   Drug use: Not on file   Sexual activity: Not on file  Other Topics Concern   Not on  file  Social History Narrative   Not on file   Social Determinants of Health   Financial Resource Strain: Not on file  Food Insecurity: Not on file  Transportation Needs: Not on file  Physical Activity: Not on file  Stress: Not on file  Social Connections: Not on file     Family History: The patient's family history includes Cancer in her mother; Diabetes in her mother; Hypertension in her mother.  ROS:   Please see the history of present illness.     All other systems reviewed and are negative.  EKGs/Labs/Other Studies Reviewed:    The following studies were reviewed today:   EKG:   04/25/2021: Atrial Fibrillation, rate 90, poor R wave progression 03/31/2021: Atrial fibrillation, rate 99 bpm, No ST abnormalities  Recent Labs: 05/24/2021: BUN 12; Creatinine, Ser 0.83; Hemoglobin 18.6; Platelets CANCELED; Potassium 4.0; Sodium 134  Recent Lipid Panel No results found for: CHOL, TRIG, HDL, CHOLHDL, VLDL, LDLCALC, LDLDIRECT  Physical Exam:    VS:  BP (!) 161/87   Pulse 93   Temp (!) 97 F (36.1 C) (Axillary)   Resp 14   Ht 5' 2.5" (1.588 m)   Wt 63.5 kg   BMI 25.20 kg/m     Wt Readings from Last 3 Encounters:  06/01/21 63.5 kg  04/25/21 63.5 kg  03/31/21 65.3 kg     GEN: Well nourished, well developed in no acute distress HEENT: Normal NECK: No JVD; No carotid bruits LYMPHATICS: No lymphadenopathy CARDIAC: Irregular rhythm, normal rate, no murmurs, rubs, gallops RESPIRATORY:  Clear to auscultation without rales, wheezing or rhonchi  ABDOMEN: Soft, non-tender, non-distended MUSCULOSKELETAL:  No edema; No deformity  SKIN: Warm and dry NEUROLOGIC:  Alert and oriented x 3 PSYCHIATRIC:  Normal affect   ASSESSMENT:    No diagnosis found.  PLAN:    Persistent atrial fibrillation: New diagnosis.  CHA2DS2-VASc score 3 (hypertension, age, female). -Rates appear controlled, continue metoprolol 25 mg twice daily -Continue Eliquis 5 mg twice daily -Echocardiogram  normal EF mild to moderate AR  -[facility] today    RTC in 6 weeks     Signed, Jenkins Rouge, MD  06/01/2021 9:12 AM    [place]

## 2021-06-01 NOTE — Anesthesia Postprocedure Evaluation (Signed)
Anesthesia Post Note  Patient: Annette Kelley  Procedure(s) Performed: CARDIOVERSION     Patient location during evaluation: PACU Anesthesia Type: General Level of consciousness: awake and alert Pain management: pain level controlled Vital Signs Assessment: post-procedure vital signs reviewed and stable Respiratory status: spontaneous breathing, nonlabored ventilation and respiratory function stable Cardiovascular status: stable and blood pressure returned to baseline Anesthetic complications: no   No notable events documented.  Last Vitals:  Vitals:   06/01/21 1035 06/01/21 1045  BP: (!) 152/71 (!) 144/82  Pulse: (!) 58 71  Resp: (!) 22 20  Temp:    SpO2: 98% 100%    Last Pain:  Vitals:   06/01/21 1045  TempSrc:   PainSc: 0-No pain                 Audry Pili

## 2021-06-01 NOTE — Discharge Instructions (Signed)

## 2021-06-01 NOTE — Transfer of Care (Signed)
Immediate Anesthesia Transfer of Care Note  Patient: Annette Kelley  Procedure(s) Performed: CARDIOVERSION  Patient Location: Endoscopy Unit  Anesthesia Type:General  Level of Consciousness: awake and drowsy  Airway & Oxygen Therapy: Patient Spontanous Breathing  Post-op Assessment: Report given to RN and Post -op Vital signs reviewed and stable  Post vital signs: Reviewed and stable  Last Vitals:  Vitals Value Taken Time  BP 120/54 06/01/21 1012  Temp    Pulse 63 06/01/21 1014  Resp 18 06/01/21 1014  SpO2 98 % 06/01/21 1014    Last Pain:  Vitals:   06/01/21 0900  TempSrc: Axillary  PainSc: 0-No pain         Complications: No notable events documented.

## 2021-06-04 NOTE — Progress Notes (Deleted)
Cardiology Office Note:    Date:  06/04/2021   ID:  Annette Kelley, DOB Jul 23, 1951, MRN [id number]  PCP:  Bartholome Bill, MD  Cardiologist:  None  Electrophysiologist:  None   Referring MD: Bartholome Bill, MD   No chief complaint on file.   History of Present Illness:    Annette Kelley is a 70 y.o. female with a hx of atrial fibrillation, hypertension, hyperlipidemia who presents for follow-up.  She was referred by Dr. Luciana Axe for evaluation of atrial fibrillation, initially seen on 03/31/2021.  She was seen by Dr. Luciana Axe on 02/22/2021.  Found to be in new atrial fibrillation, rate 100s.  For activity, she enjoys playing golf (ride and walk the course), and also cleans the house, and does not notice any exertional symptoms. However, she does not enjoy walking for formal exercise. When she is very fatigued she has been known to snore, but is relieved by propping up her head. She has not participated in a sleep study. She denies any chest pain, shortness of breath, or palpitations. No pre-syncope, syncope, or lightheadedness to note. Also has no lower extremity edema, orthopnea or PND. Typically she smokes 1 ppd. Previously, she quit smoking for a period of 12 years. At this time, she does not wish to quit smoking as it is a stress reliever for her. She confirms a history of hypertension. Two of her brothers have Afib. Her father had a cerebral stroke around 53-50 yo. Her mother had a stroke in her late 40s, and had a pacemaker. She is unsure if her parents had Afib.   Echocardiogram in 04/25/2021 showed normal biventricular function, mild to moderate aortic regurgitation.  Underwent successful cardioversion on 06/01/2021.  Since last clinic visit,    Past Medical History:  Diagnosis Date   High cholesterol    Hypertension     Past Surgical History:  Procedure Laterality Date   APPENDECTOMY     CARDIOVERSION N/A 06/01/2021   Procedure: CARDIOVERSION;  Surgeon: Josue Hector, MD;   Location: [facility] ENDOSCOPY;  Service: Cardiovascular;  Laterality: N/A;   TUBAL LIGATION      Current Medications: No outpatient medications have been marked as taking for the 06/07/21 encounter (Appointment) with Donato Heinz, MD.     Allergies:   Other and Sulfa antibiotics   Social History   Socioeconomic History   Marital status: Single    Spouse name: Not on file   Number of children: Not on file   Years of education: Not on file   Highest education level: Not on file  Occupational History   Not on file  Tobacco Use   Smoking status: Every Day    Packs/day: 1.00    Types: Cigarettes   Smokeless tobacco: Never  Substance and Sexual Activity   Alcohol use: Yes    Alcohol/week: 2.0 standard drinks    Types: 2 Cans of beer per week   Drug use: Not on file   Sexual activity: Not on file  Other Topics Concern   Not on file  Social History Narrative   Not on file   Social Determinants of Health   Financial Resource Strain: Not on file  Food Insecurity: Not on file  Transportation Needs: Not on file  Physical Activity: Not on file  Stress: Not on file  Social Connections: Not on file     Family History: The patient's family history includes Cancer in her mother; Diabetes in her mother; Hypertension in her  mother.  ROS:   Please see the history of present illness.     All other systems reviewed and are negative.  EKGs/Labs/Other Studies Reviewed:    The following studies were reviewed today:   EKG:   04/25/2021: Atrial Fibrillation, rate 90, poor R wave progression 03/31/2021: Atrial fibrillation, rate 99 bpm, No ST abnormalities  Recent Labs: 05/24/2021: BUN 12; Creatinine, Ser 0.83; Hemoglobin 18.6; Platelets CANCELED; Potassium 4.0; Sodium 134  Recent Lipid Panel No results found for: CHOL, TRIG, HDL, CHOLHDL, VLDL, LDLCALC, LDLDIRECT  Physical Exam:    VS:  There were no vitals taken for this visit.    Wt Readings from Last 3 Encounters:   06/01/21 140 lb (63.5 kg)  04/25/21 140 lb (63.5 kg)  03/31/21 144 lb (65.3 kg)     GEN: Well nourished, well developed in no acute distress HEENT: Normal NECK: No JVD; No carotid bruits LYMPHATICS: No lymphadenopathy CARDIAC: Irregular rhythm, normal rate, no murmurs, rubs, gallops RESPIRATORY:  Clear to auscultation without rales, wheezing or rhonchi  ABDOMEN: Soft, non-tender, non-distended MUSCULOSKELETAL:  No edema; No deformity  SKIN: Warm and dry NEUROLOGIC:  Alert and oriented x 3 PSYCHIATRIC:  Normal affect   ASSESSMENT:    No diagnosis found.  PLAN:    Persistent atrial fibrillation: New diagnosis.  CHA2DS2-VASc score 3 (hypertension, age, female).  Echocardiogram in 04/25/2021 showed normal biventricular function, mild to moderate aortic regurgitation.  Underwent successful cardioversion on 06/01/2021. -Continue metoprolol 25 mg twice daily -Continue Eliquis 5 mg twice daily -Sleep study  Aortic regurgitation: Mild to moderate AI on echo 04/25/2021.  Will monitor   Hypertension: On hydrochlorothiazide 25 mg daily and telmisartan 80 mg daily.  Appears controlled.   Hyperlipidemia: On atorvastatin 20 mg daily. LDL 70 on 02/23/2021   Snoring: will check sleep study given new A. fib as above   RTC in ***   Medication Adjustments/Labs and Tests Ordered: Current medicines are reviewed at length with the patient today.  Concerns regarding medicines are outlined above.  No orders of the defined types were placed in this encounter.  No orders of the defined types were placed in this encounter.   There are no Patient Instructions on file for this visit.   I,Mathew Stumpf,acting as a Education administrator for Donato Heinz, MD.,have documented all relevant documentation on the behalf of Donato Heinz, MD,as directed by  Donato Heinz, MD while in the presence of Donato Heinz, MD.  I, Donato Heinz, MD, have reviewed all documentation for  this visit. The documentation on 06/04/21 for the exam, diagnosis, procedures, and orders are all accurate and complete.   Signed, Donato Heinz, MD  06/04/2021 3:15 PM    [place]

## 2021-06-05 NOTE — Progress Notes (Signed)
Cardiology Office Note:    Date:  06/07/2021   ID:  Annette Kelley, DOB 06-20-1951, MRN [id number]  PCP:  Bartholome Bill, MD  Cardiologist:  None  Electrophysiologist:  None   Referring MD: Bartholome Bill, MD   Chief Complaint  Patient presents with   Follow-up    6 months.   Atrial Fibrillation     History of Present Illness:    Annette Kelley is a 70 y.o. female with a hx of atrial fibrillation, hypertension, hyperlipidemia who presents for follow-up.  She was referred by Dr. Luciana Axe for evaluation of atrial fibrillation, initially seen on 03/31/2021.  She was seen by Dr. Luciana Axe on 02/22/2021.  Found to be in new atrial fibrillation, rate 100s.  For activity, she enjoys playing golf (ride and walk the course), and also cleans the house, and does not notice any exertional symptoms. However, she does not enjoy walking for formal exercise. When she is very fatigued she has been known to snore, but is relieved by propping up her head. She has not participated in a sleep study. She denies any chest pain, shortness of breath, or palpitations. No pre-syncope, syncope, or lightheadedness to note. Also has no lower extremity edema, orthopnea or PND. Typically she smokes 1 ppd. Previously, she quit smoking for a period of 12 years. At this time, she does not wish to quit smoking as it is a stress reliever for her. She confirms a history of hypertension. Two of her brothers have Afib. Her father had a cerebral stroke around 94-50 yo. Her mother had a stroke in her late 37s, and had a pacemaker. She is unsure if her parents had Afib.   Echocardiogram in 04/25/2021 showed normal biventricular function, mild to moderate aortic regurgitation.  Underwent successful cardioversion on 06/01/2021.  Since last clinic visit, she is feeling reasonably well. She denies blood in stool, chest pains, lightheadedness, shortness of breath,palpitations, or LE edema. She believed her afib was caused by stress. She  currently takes Eliquis 5 MG 9 am and 9 pm and Toprol-xl 50 MG. Only takes Meloxicam 7.5 MG when her knee pain occurs. She has not been exercising but states she is going to start playing golf again.    Past Medical History:  Diagnosis Date   High cholesterol    Hypertension     Past Surgical History:  Procedure Laterality Date   APPENDECTOMY     CARDIOVERSION N/A 06/01/2021   Procedure: CARDIOVERSION;  Surgeon: Josue Hector, MD;  Location: [facility] ENDOSCOPY;  Service: Cardiovascular;  Laterality: N/A;   TUBAL LIGATION      Current Medications: Current Meds  Medication Sig   apixaban (ELIQUIS) 5 MG TABS tablet TAKE 1 TABLET BY MOUTH TWICE DAILY   atorvastatin (LIPITOR) 20 MG tablet Take 20 mg by mouth daily.   cyclobenzaprine (FLEXERIL) 5 MG tablet Take 1-2 tablets (5-10 mg total) by mouth 2 (two) times daily as needed for muscle spasms.   diclofenac Sodium (VOLTAREN) 1 % GEL Apply 1 application topically daily as needed (pain).   hydrochlorothiazide (HYDRODIURIL) 25 MG tablet Take 25 mg by mouth every other day.   meloxicam (MOBIC) 7.5 MG tablet Take 7.5 mg by mouth daily as needed for pain.   metoprolol succinate (TOPROL-XL) 50 MG 24 hr tablet Take 50 mg by mouth daily. Take with or immediately following a meal.   telmisartan (MICARDIS) 80 MG tablet Take 80 mg by mouth daily.     Allergies:  Other and Sulfa antibiotics   Social History   Socioeconomic History   Marital status: Single    Spouse name: Not on file   Number of children: Not on file   Years of education: Not on file   Highest education level: Not on file  Occupational History   Not on file  Tobacco Use   Smoking status: Every Day    Packs/day: 1.00    Types: Cigarettes   Smokeless tobacco: Never  Substance and Sexual Activity   Alcohol use: Yes    Alcohol/week: 2.0 standard drinks    Types: 2 Cans of beer per week   Drug use: Not on file   Sexual activity: Not on file  Other Topics Concern   Not on  file  Social History Narrative   Not on file   Social Determinants of Health   Financial Resource Strain: Not on file  Food Insecurity: Not on file  Transportation Needs: Not on file  Physical Activity: Not on file  Stress: Not on file  Social Connections: Not on file     Family History: The patient's family history includes Cancer in her mother; Diabetes in her mother; Hypertension in her mother.  ROS:   Please see the history of present illness.     (+) All other systems reviewed and are negative.  EKGs/Labs/Other Studies Reviewed:    The following studies were reviewed today: ECHO 04/25/21:   IMPRESSIONS    1. The aortic valve is tricuspid. There is mild calcification of the  aortic valve. There is moderate thickening of the aortic valve. Aortic  valve regurgitation is mild to moderate. Mild aortic valve sclerosis is  present, with no evidence of aortic valve   stenosis.   2. Left ventricular ejection fraction, by estimation, is 55 to 60%. The  left ventricle has normal function. The left ventricle has no regional  wall motion abnormalities. Left ventricular diastolic parameters are  indeterminate.   3. Right ventricular systolic function is normal. The right ventricular  size is normal. Tricuspid regurgitation signal is inadequate for assessing  PA pressure.   4. The mitral valve is normal in structure. Trivial mitral valve  regurgitation. No evidence of mitral stenosis.   5. The inferior vena cava is normal in size with greater than 50%  respiratory variability, suggesting right atrial pressure of 3 mmHg.   EKG:   06/07/21: sinus rhythm, rate 68 bpm, poor r-wave progression 04/25/2021: Atrial Fibrillation, rate 90, poor R wave progression 03/31/2021: Atrial fibrillation, rate 99 bpm, No ST abnormalities  Recent Labs: 05/24/2021: BUN 12; Creatinine, Ser 0.83; Hemoglobin 18.6; Platelets CANCELED; Potassium 4.0; Sodium 134  Recent Lipid Panel No results found for:  CHOL, TRIG, HDL, CHOLHDL, VLDL, LDLCALC, LDLDIRECT  Physical Exam:    VS:  BP 134/86 (BP Location: Right Arm, Patient Position: Sitting, Cuff Size: Normal)   Pulse 68   Ht 5\' 3"  (1.6 m)   Wt 140 lb (63.5 kg)   BMI 24.80 kg/m     Wt Readings from Last 3 Encounters:  06/07/21 140 lb (63.5 kg)  06/01/21 140 lb (63.5 kg)  04/25/21 140 lb (63.5 kg)     GEN: Well nourished, well developed in no acute distress HEENT: Normal NECK: No JVD; No carotid bruits LYMPHATICS: No lymphadenopathy CARDIAC: RRR, no murmurs, rubs, gallops RESPIRATORY:  Clear to auscultation without rales, wheezing or rhonchi  ABDOMEN: Soft, non-tender, non-distended MUSCULOSKELETAL:  No edema; No deformity  SKIN: Warm and dry NEUROLOGIC:  Alert and oriented x 3 PSYCHIATRIC:  Normal affect   ASSESSMENT:    1. Persistent atrial fibrillation ([place])   2. Aortic valve insufficiency, etiology of cardiac valve disease unspecified   3. Essential hypertension   4. Hyperlipidemia, unspecified hyperlipidemia type   5. Snoring     PLAN:    Persistent atrial fibrillation: New diagnosis.  CHA2DS2-VASc score 3 (hypertension, age, female).  Echocardiogram in 04/25/2021 showed normal biventricular function, mild to moderate aortic regurgitation.  Underwent successful cardioversion on 06/01/2021.  Appears to be maintaining sinus rhythm -Continue toprol XL 50 mg daily -Continue Eliquis 5 mg twice daily -Sleep study recommended  Aortic regurgitation: Mild to moderate AI on echo 04/25/2021.  Will monitor   Hypertension: On hydrochlorothiazide 25 mg daily and telmisartan 80 mg daily.  Appears controlled.   Hyperlipidemia: On atorvastatin 20 mg daily. LDL 70 on 02/23/2021   Snoring: recommend sleep study given new A. fib as above.  She states that she will consider it and get back to [place] if she decides to proceed   RTC in 6 months   Medication Adjustments/Labs and Tests Ordered: Current medicines are reviewed at length with the  patient today.  Concerns regarding medicines are outlined above.  No orders of the defined types were placed in this encounter.  No orders of the defined types were placed in this encounter.   Patient Instructions  Medication Instructions:  Your physician recommends that you continue on your current medications as directed. Please refer to the Current Medication list given to you today.  *If you need a refill on your cardiac medications before your next appointment, please call your pharmacy*  Follow-Up: At [facility], you and your health needs are our priority.  As part of our continuing mission to provide you with exceptional heart care, we have created designated Provider Care Teams.  These Care Teams include your primary Cardiologist (physician) and Advanced Practice Providers (APPs -  Physician Assistants and Nurse Practitioners) who all work together to provide you with the care you need, when you need it.  We recommend signing up for the patient portal called "MyChart".  Sign up information is provided on this After Visit Summary.  MyChart is used to connect with patients for Virtual Visits (Telemedicine).  Patients are able to view lab/test results, encounter notes, upcoming appointments, etc.  Non-urgent messages can be sent to your provider as well.   To learn more about what you can do with MyChart, go to [email or web address].    Your next appointment:   6 month(s)  The format for your next appointment:   In Person  Provider:   Oswaldo Milian, MD      Ardell Isaacs as a scribe for Donato Heinz, MD.,have documented all relevant documentation on the behalf of Donato Heinz, MD,as directed by  Donato Heinz, MD while in the presence of Donato Heinz, MD.   I, Donato Heinz, MD, have reviewed all documentation for this visit. The documentation on 06/07/21 for the exam, diagnosis, procedures, and orders are all  accurate and complete.     Signed, Donato Heinz, MD  06/07/2021 12:11 PM    [facility]

## 2021-06-06 ENCOUNTER — Encounter (HOSPITAL_BASED_OUTPATIENT_CLINIC_OR_DEPARTMENT_OTHER): Payer: Medicare Other | Admitting: Cardiovascular Disease

## 2021-06-07 ENCOUNTER — Encounter: Payer: Self-pay | Admitting: Cardiology

## 2021-06-07 ENCOUNTER — Other Ambulatory Visit: Payer: Self-pay

## 2021-06-07 ENCOUNTER — Ambulatory Visit: Payer: Medicare Other | Admitting: Cardiology

## 2021-06-07 VITALS — BP 134/86 | HR 68 | Ht 63.0 in | Wt 140.0 lb

## 2021-06-07 DIAGNOSIS — I351 Nonrheumatic aortic (valve) insufficiency: Secondary | ICD-10-CM | POA: Diagnosis not present

## 2021-06-07 DIAGNOSIS — I4819 Other persistent atrial fibrillation: Secondary | ICD-10-CM

## 2021-06-07 DIAGNOSIS — R0683 Snoring: Secondary | ICD-10-CM

## 2021-06-07 DIAGNOSIS — E785 Hyperlipidemia, unspecified: Secondary | ICD-10-CM

## 2021-06-07 DIAGNOSIS — I10 Essential (primary) hypertension: Secondary | ICD-10-CM

## 2021-06-07 NOTE — Patient Instructions (Signed)

## 2021-10-25 ENCOUNTER — Other Ambulatory Visit: Payer: Self-pay

## 2021-10-25 MED ORDER — APIXABAN 5 MG PO TABS: 5.0000 mg | ORAL_TABLET | Freq: Two times a day (BID) | ORAL | 5 refills | Status: DC

## 2021-10-25 NOTE — Telephone Encounter (Signed)
Prescription refill request for Eliquis received. Indication:Afib Last office visit:7/22 Scr:0.8 Age: 70 Weight:63.5 kg  Prescription refilled

## 2021-10-30 ENCOUNTER — Telehealth: Payer: Self-pay

## 2021-10-30 NOTE — Telephone Encounter (Signed)
Call us if you are still interested in completing your sleep study. Your order will expire in 30 days of this letter. If we have not heard from you within this time frame you will need to discuss this further with your provider at your next office visit.  Sincerely,  [facility]

## 2021-11-10 IMAGING — CR DG KNEE COMPLETE 4+V*L*
4 series · 4 of 4 positions shown · non-contrast
Comparison: None.

CLINICAL DATA: Chronic knee pain for 14 years, worsening the past
month

EXAM:
LEFT KNEE - COMPLETE 4+ VIEW

[t knee ap left (1 of 4)]
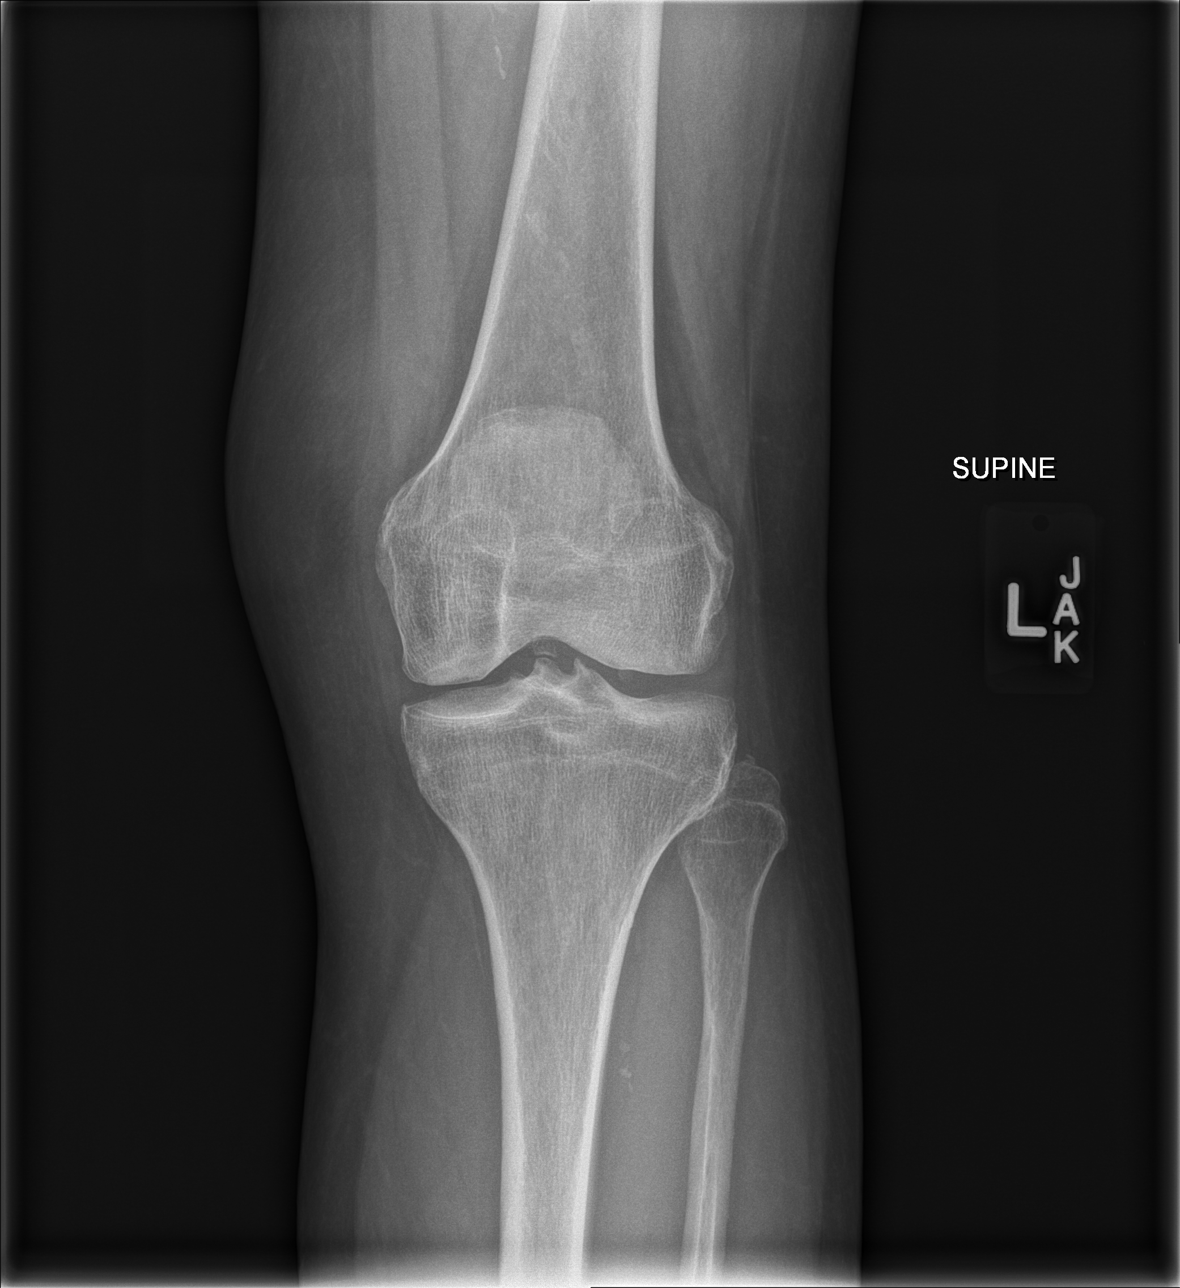

[t knee ap left (2 of 4)]
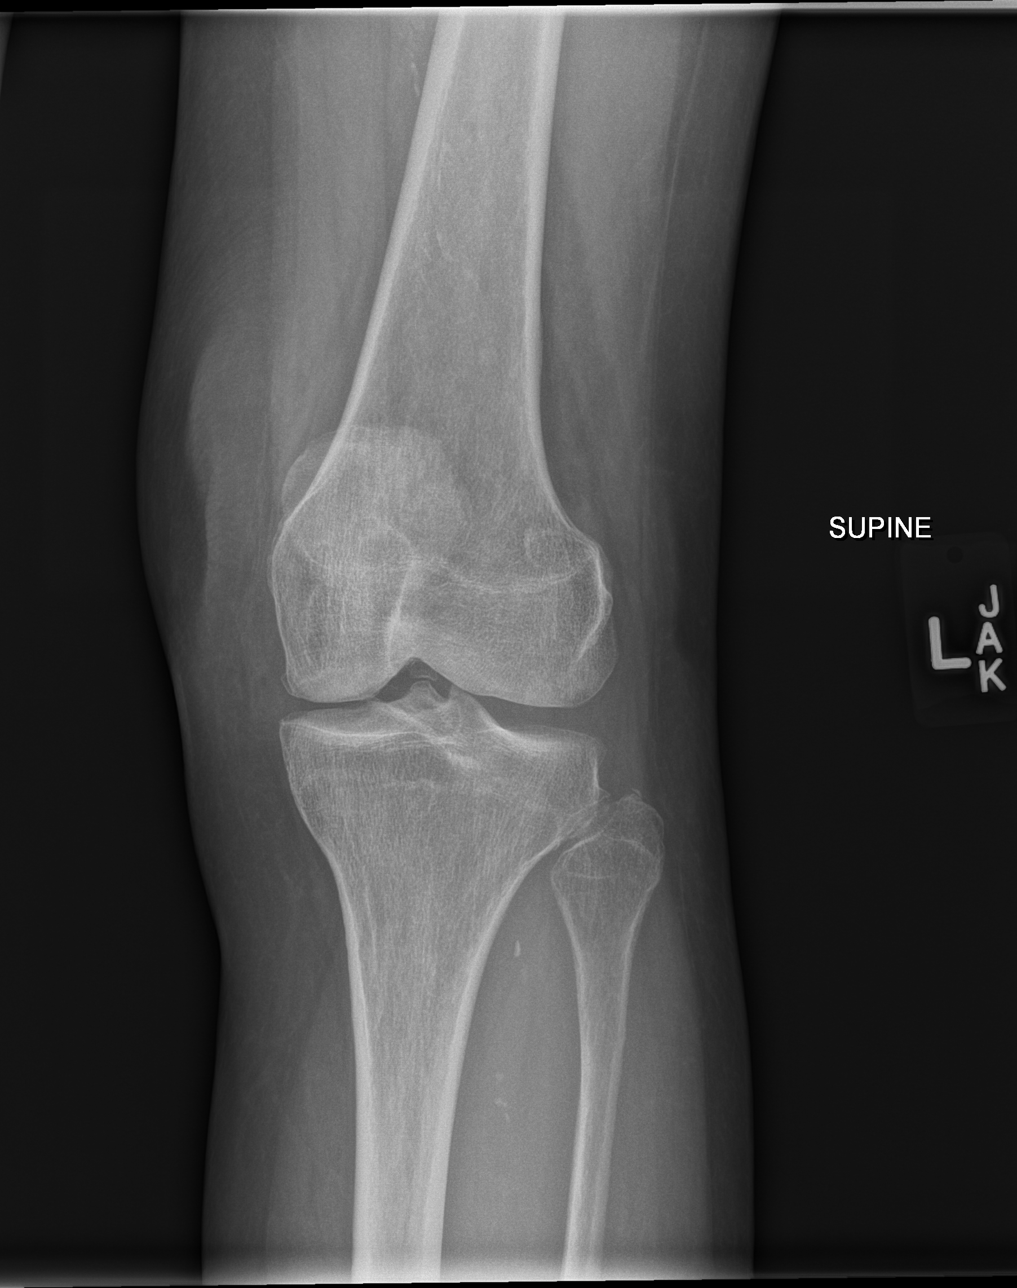

[t knee ap left (3 of 4)]
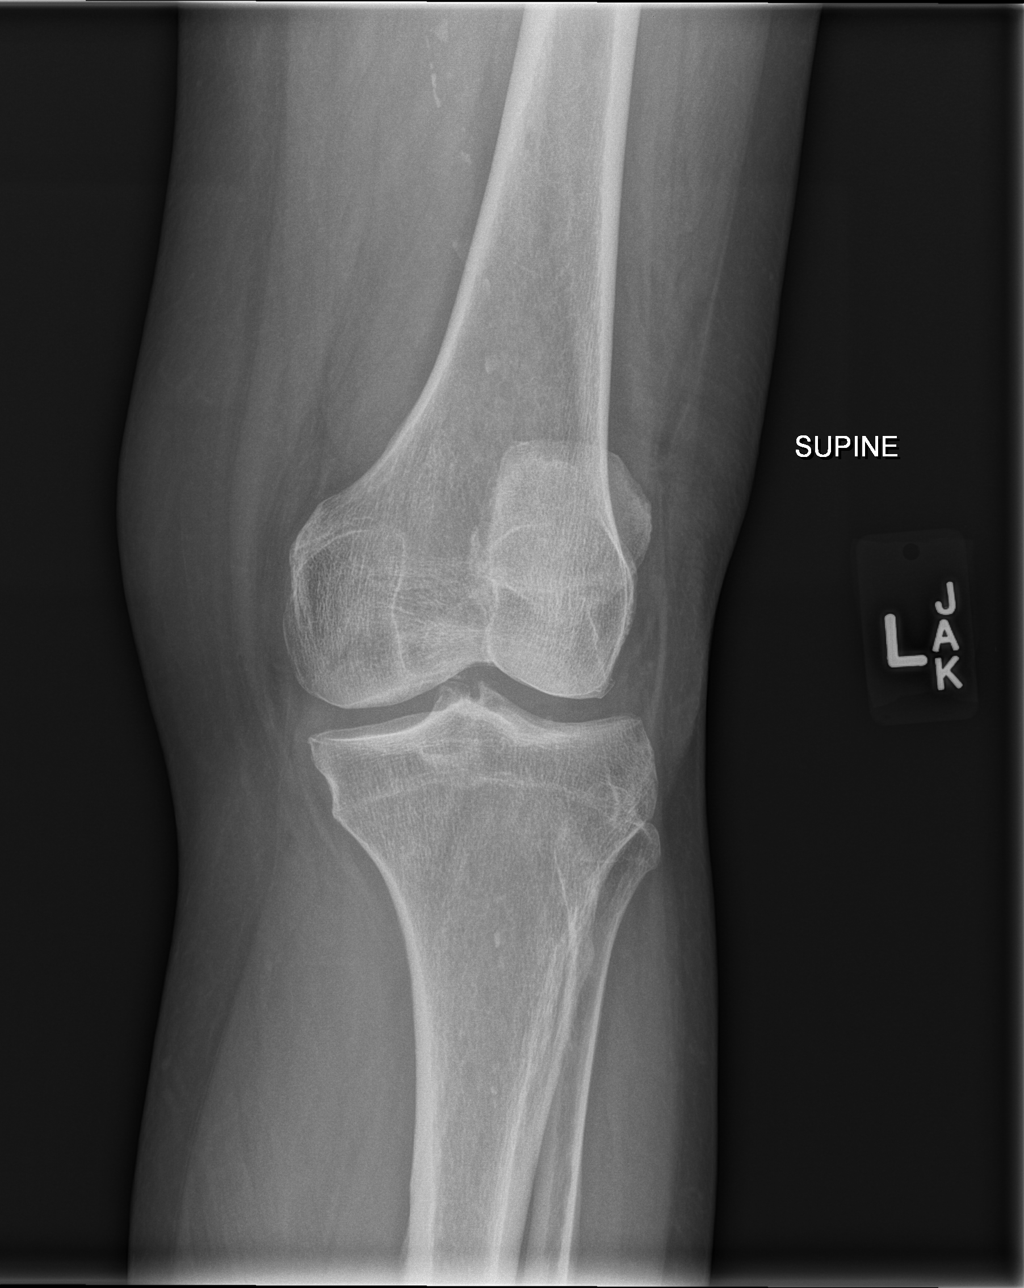

[t knee ap left (4 of 4)]
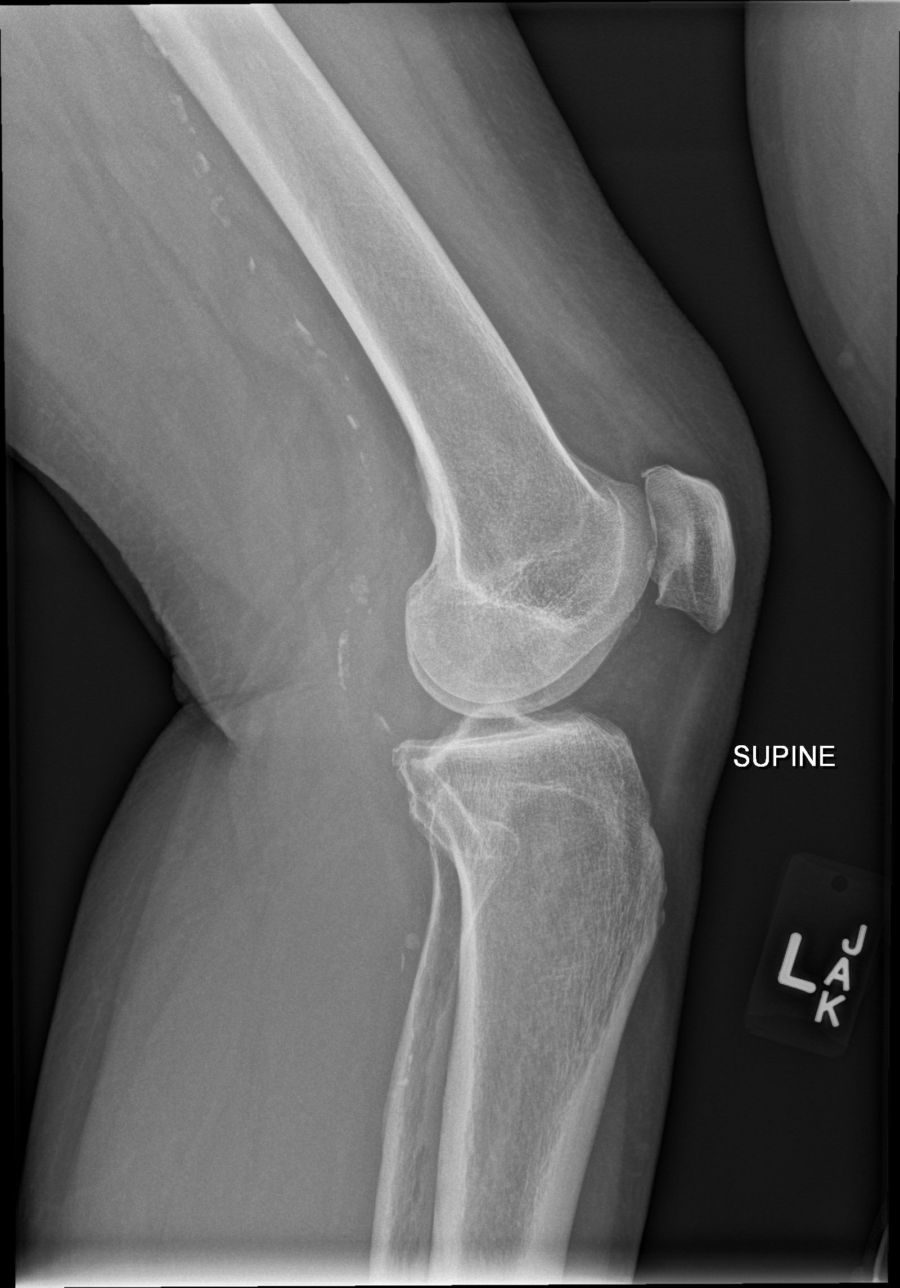

[4 of 4 positions shown; findings below may reference images not displayed]

FINDINGS: Corticated fragment arising from the medial tibial spine could
reflect remote avulsion type injury. No acute fracture or traumatic
malalignment is seen. Trace suprapatellar effusion. Mild
tricompartmental degenerative changes most pronounced in the
patellofemoral compartment additional arthrosis at the proximal
tibia fibular joint. Vascular calcium in the posterior soft tissues.
No significant swelling, gas or other acute soft tissue abnormality.
IMPRESSION: 1. Trace suprapatellar effusion.  No acute osseous abnormality.
2. Remote avulsion type injury of the medial tibial spine.
3. Mild tricompartmental degenerative changes, most pronounced in
the patellofemoral compartment.

## 2022-01-08 ENCOUNTER — Other Ambulatory Visit: Payer: Self-pay

## 2022-01-08 ENCOUNTER — Ambulatory Visit: Payer: Medicare Other | Admitting: Podiatry

## 2022-01-08 ENCOUNTER — Ambulatory Visit (INDEPENDENT_AMBULATORY_CARE_PROVIDER_SITE_OTHER): Payer: Medicare Other

## 2022-01-08 ENCOUNTER — Encounter: Payer: Self-pay | Admitting: Podiatry

## 2022-01-08 DIAGNOSIS — M2041 Other hammer toe(s) (acquired), right foot: Secondary | ICD-10-CM

## 2022-01-08 DIAGNOSIS — L814 Other melanin hyperpigmentation: Secondary | ICD-10-CM | POA: Insufficient documentation

## 2022-01-08 DIAGNOSIS — M778 Other enthesopathies, not elsewhere classified: Secondary | ICD-10-CM | POA: Diagnosis not present

## 2022-01-08 DIAGNOSIS — B070 Plantar wart: Secondary | ICD-10-CM | POA: Diagnosis not present

## 2022-01-08 DIAGNOSIS — D485 Neoplasm of uncertain behavior of skin: Secondary | ICD-10-CM | POA: Insufficient documentation

## 2022-01-08 DIAGNOSIS — L853 Xerosis cutis: Secondary | ICD-10-CM | POA: Insufficient documentation

## 2022-01-08 DIAGNOSIS — L570 Actinic keratosis: Secondary | ICD-10-CM | POA: Insufficient documentation

## 2022-01-08 MED ORDER — TRIAMCINOLONE ACETONIDE 10 MG/ML IJ SUSP
10.0000 mg | Freq: Once | INTRAMUSCULAR | Status: AC
  Administered 2022-01-08: 10 mg

## 2022-01-08 NOTE — Progress Notes (Signed)
Subjective:   Patient ID: Annette Kelley, female   DOB: 71 y.o.   MRN: [id number]   HPI Patient presents stating she has had a severe lesion on the bottom of the right foot that is been there for years and she is tried to trim it and soak it without relief and it is getting worse and becoming more painful.  Patient does not smoke a pack a day and is trying to reduce and does try to be active but the foot makes it difficult   Review of Systems  All other systems reviewed and are negative.      Objective:  Physical Exam Vitals and nursing note reviewed.  Constitutional:      Appearance: She is well-developed.  Pulmonary:     Effort: Pulmonary effort is normal.  Musculoskeletal:        General: Normal range of motion.  Skin:    General: Skin is warm.  Neurological:     Mental Status: She is alert.    Neurovascular status intact muscle strength was found to be adequate range of motion adequate with inflammation of the second metatarsal phalangeal joint and plantar lesion right which is very painful thick and within the context of the metatarsal head itself.  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  Patient will be seen back when symptomatic again for decision on what might be appropriate from the surgical versus conservative standpoint  X-rays indicate that the area of problem appears to be in  conjunction to the second metatarsal head left

## 2022-02-14 ENCOUNTER — Ambulatory Visit (INDEPENDENT_AMBULATORY_CARE_PROVIDER_SITE_OTHER): Payer: Medicare Other | Admitting: Podiatry

## 2022-02-14 ENCOUNTER — Encounter: Payer: Self-pay | Admitting: Podiatry

## 2022-02-14 DIAGNOSIS — M778 Other enthesopathies, not elsewhere classified: Secondary | ICD-10-CM | POA: Diagnosis not present

## 2022-02-14 DIAGNOSIS — M2041 Other hammer toe(s) (acquired), right foot: Secondary | ICD-10-CM | POA: Diagnosis not present

## 2022-02-15 NOTE — Progress Notes (Signed)
Subjective:  ? ?Patient ID: Annette Kelley, female   DOB: 71 y.o.   MRN: [id number]  ? ?HPI ?Patient states this area is still very tender and I know I am probably getting need surgery but I have a trip the beginning of May and want to wait till after that ? ? ?ROS ? ? ?   ?Objective:  ?Physical Exam  ?Neurovascular status intact with patient found to have a severe keratotic lesion subsecond metatarsal head right that is nucleated and painful and only had short-term relief with aggressive debridement technique ? ?   ?Assessment:  ?Chronic keratotic lesion subsecond metatarsal right that has not responded well so far conservatively ? ?   ?Plan:  ?Reviewed the probability of elevating osteotomy plantar excision.  She is reducing her smoking and I have encouraged her to continue to reduce her smoking and we will consider surgery for after her trip in May.  Debridement courtesy done today sterile dressing applied padding applied reappoint to recheck as indicated ?   ? ? ?

## 2022-03-20 NOTE — Progress Notes (Signed)
? ?Cardiology Clinic Note  ? ?Patient Name: Annette Kelley ?Date of Encounter: 03/21/2022 ? ?Primary Care Provider:  Bartholome Bill, MD ?Primary Cardiologist:  Donato Heinz, MD ? ?Patient Profile  ?  ?Annette Kelley 71 year old female presents the clinic today for follow-up evaluation of her atrial fibrillation and hypertension. ? ?Past Medical History  ?  ?Past Medical History:  ?Diagnosis Date  ? High cholesterol   ? Hypertension   ? ?Past Surgical History:  ?Procedure Laterality Date  ? APPENDECTOMY    ? CARDIOVERSION N/A 06/01/2021  ? Procedure: CARDIOVERSION;  Surgeon: Josue Hector, MD;  Location: [facility] ENDOSCOPY;  Service: Cardiovascular;  Laterality: N/A;  ? TUBAL LIGATION    ? ? ?Allergies ? ?Allergies  ?Allergen Reactions  ? Other Anaphylaxis  ?  Cannot lick envelopes - feels like throat is closing   ? Sulfa Antibiotics Itching  ? ? ?History of Present Illness  ?  ?Annette Kelley is a PMH of atrial fibrillation, HLD, and HTN.  She was referred by her PCP for evaluation of her atrial fibrillation that was identified 03/31/2021.  Her heart rate was in the 100s. ? ?She was seen by Dr.Schumann 06/07/2021.  During that time she reported that she stay physically active playing golf and doing house chores.  She denied exertional symptoms.  She also enjoyed walking for exercise.  She did report snoring which was relieved by propping up her head.  Sleep study was recommended.  She had not participated in a sleep study.  She denied chest pain, shortness of breath, and palpitations.  She denied presyncope and syncope.  She denied lower extremity swelling orthopnea and PND.  She was smoking 1 pack/day.  She had previously quit smoking for 12-year period.  She did not wish to quit smoking as it was a stress reliever.  She did undergo a successful cardioversion 06/01/2021.  She reported compliance with her apixaban, metoprolol.  She only was taking meloxicam 7.5 mg as needed for her knee pain. ? ?She  presents to the clinic today for follow-up evaluation states she feels well.  She reports that she occasionally feels irregular heartbeats that last for minutes at a time and dissipate on their own.  Her EKG today shows atrial fibrillation.  She denies any shortness of breath, lack of endurance, or increased fatigue.  She continues to play golf.  She does report that she has not played in the last several weeks due to a plantar wart on the bottom of her right foot.  She continues to be physically active doing things around her house and shopping weekly.  She denies chest pain with increased physical activity.  She is planning to go to [facility] this summer.  She continues to walk her dogs regularly.  She reports compliance with her medications and denies side effects.  We reviewed the importance of continuing her apixaban given her atrial fibrillation.  We reviewed options for repeat DCCV.  Shared decision-making was used to continue anticoagulation and rate control.  I will give her the salty 6 diet sheet, have her maintain her physical activity, avoid triggers for atrial fibrillation, and plan follow-up in 9 to 12 months.  We will give her a blood pressure log. ? ?Today she denies chest pain, shortness of breath, lower extremity edema, fatigue, palpitations, melena, hematuria, hemoptysis, diaphoresis, weakness, presyncope, syncope, orthopnea, and PND. ? ? ?Home Medications  ?  ?Prior to Admission medications   ?Medication Sig Start Date End  Date Taking? Authorizing Provider  ?apixaban (ELIQUIS) 5 MG TABS tablet Take 1 tablet (5 mg total) by mouth 2 (two) times daily. 10/25/21   Donato Heinz, MD  ?atorvastatin (LIPITOR) 20 MG tablet Take 1 tablet by mouth daily. 12/29/21   [provider]  ?diclofenac Sodium (VOLTAREN) 1 % GEL Apply 1 application topically daily as needed (pain).    [provider]  ?hydrochlorothiazide (HYDRODIURIL) 25 MG tablet Take 25 mg by mouth every other day.     [provider]  ?meloxicam (MOBIC) 7.5 MG tablet Take 7.5 mg by mouth daily as needed for pain. 02/22/21   [provider]  ?metoprolol succinate (TOPROL-XL) 50 MG 24 hr tablet Take 50 mg by mouth daily. Take with or immediately following a meal.    [provider]  ?telmisartan (MICARDIS) 80 MG tablet Take 80 mg by mouth daily.    [provider]  ? ? ?Family History  ?  ?Family History  ?Problem Relation Age of Onset  ? Diabetes Mother   ? Cancer Mother   ? Hypertension Mother   ? ?She indicated that her mother is deceased. ? ?Social History  ?  ?Social History  ? ?Socioeconomic History  ? Marital status: Single  ?  Spouse name: Not on file  ? Number of children: Not on file  ? Years of education: Not on file  ? Highest education level: Not on file  ?Occupational History  ? Not on file  ?Tobacco Use  ? Smoking status: Every Day  ?  Packs/day: 1.00  ?  Types: Cigarettes  ? Smokeless tobacco: Never  ?Substance and Sexual Activity  ? Alcohol use: Yes  ?  Alcohol/week: 2.0 standard drinks  ?  Types: 2 Cans of beer per week  ? Drug use: Not on file  ? Sexual activity: Not on file  ?Other Topics Concern  ? Not on file  ?Social History Narrative  ? Not on file  ? ?Social Determinants of Health  ? ?Financial Resource Strain: Not on file  ?Food Insecurity: Not on file  ?Transportation Needs: Not on file  ?Physical Activity: Not on file  ?Stress: Not on file  ?Social Connections: Not on file  ?Intimate Partner Violence: Not on file  ?  ? ?Review of Systems  ?  ?General:  No chills, fever, night sweats or weight changes.  ?Cardiovascular:  No chest pain, dyspnea on exertion, edema, orthopnea, palpitations, paroxysmal nocturnal dyspnea. ?Dermatological: No rash, lesions/masses ?Respiratory: No cough, dyspnea ?Urologic: No hematuria, dysuria ?Abdominal:   No nausea, vomiting, diarrhea, bright red blood per rectum, melena, or hematemesis ?Neurologic:  No visual changes, wkns, changes in mental  status. ?All other systems reviewed and are otherwise negative except as noted above. ? ?Physical Exam  ?  ?VS:  BP 126/72   Pulse 76   Ht '5\' 3"'$  (1.6 m)   Wt 155 lb 12.8 oz (70.7 kg)   BMI 27.60 kg/m?  , BMI Body mass index is 27.6 kg/m?. ?GEN: Well nourished, well developed, in no acute distress. ?HEENT: normal. ?Neck: Supple, no JVD, carotid bruits, or masses. ?Cardiac: RRR, no murmurs, rubs, or gallops. No clubbing, cyanosis, edema.  Radials/DP/PT 2+ and equal bilaterally.  ?Respiratory:  Respirations regular and unlabored, clear to auscultation bilaterally. ?GI: Soft, nontender, nondistended, BS + x 4. ?MS: no deformity or atrophy. ?Skin: warm and dry, no rash. ?Neuro:  Strength and sensation are intact. ?Psych: Normal affect. ? ?Accessory Clinical Findings  ?  ?  Recent Labs: ?05/24/2021: BUN 12; Creatinine, Ser 0.83; Hemoglobin 18.6; Platelets CANCELED; Potassium 4.0; Sodium 134  ? ?Recent Lipid Panel ?No results found for: CHOL, TRIG, HDL, CHOLHDL, VLDL, LDLCALC, LDLDIRECT ? ?ECG personally reviewed by me today-atrial fibrillation septal infarct undetermined age 8 bpm- No acute changes ? ?Echocardiogram 04/25/2021 ?IMPRESSIONS  ? ? ? 1. The aortic valve is tricuspid. There is mild calcification of the  ?aortic valve. There is moderate thickening of the aortic valve. Aortic  ?valve regurgitation is mild to moderate. Mild aortic valve sclerosis is  ?present, with no evidence of aortic valve  ? stenosis.  ? 2. Left ventricular ejection fraction, by estimation, is 55 to 60%. The  ?left ventricle has normal function. The left ventricle has no regional  ?wall motion abnormalities. Left ventricular diastolic parameters are  ?indeterminate.  ? 3. Right ventricular systolic function is normal. The right ventricular  ?size is normal. Tricuspid regurgitation signal is inadequate for assessing  ?PA pressure.  ? 4. The mitral valve is normal in structure. Trivial mitral valve  ?regurgitation. No evidence of mitral  stenosis.  ? 5. The inferior vena cava is normal in size with greater than 50%  ?respiratory variability, suggesting right atrial pressure of 3 mmHg.  ? ?Comparison(s): No prior Echocardiogram.  ? ?Assessment & Plan

## 2022-03-21 ENCOUNTER — Encounter: Payer: Self-pay | Admitting: General Practice

## 2022-03-21 ENCOUNTER — Ambulatory Visit: Payer: Medicare Other | Admitting: General Practice

## 2022-03-21 VITALS — BP 126/72 | HR 76 | Ht 63.0 in | Wt 155.8 lb

## 2022-03-21 DIAGNOSIS — I4819 Other persistent atrial fibrillation: Secondary | ICD-10-CM | POA: Diagnosis not present

## 2022-03-21 DIAGNOSIS — I351 Nonrheumatic aortic (valve) insufficiency: Secondary | ICD-10-CM

## 2022-03-21 DIAGNOSIS — E785 Hyperlipidemia, unspecified: Secondary | ICD-10-CM

## 2022-03-21 DIAGNOSIS — I10 Essential (primary) hypertension: Secondary | ICD-10-CM

## 2022-03-21 NOTE — Patient Instructions (Signed)
.  Medication Instructions:  ?The current medical regimen is effective;  continue present plan and medications as directed. Please refer to the Current Medication list given to you today. ? ?*If you need a refill on your cardiac medications before your next appointment, please call your pharmacy* ? ?Lab Work:   Testing/Procedures:  ?NONE    NONE ? ?If you have labs (blood work) drawn today and your tests are completely normal, you will receive your results only by: ?MyChart Message (if you have MyChart) OR  A paper copy in the mail ?If you have any lab test that is abnormal or we need to change your treatment, we will call you to review the results. ? ?Special Instructions ?PLEASE READ AND FOLLOW SALTY 6-ATTACHED-1,'800mg'$  daily ? ?PLEASE INCREASE PHYSICAL ACTIVITY AS TOLERATED  ? ?Please try to avoid these triggers: ?Do not use any products that have nicotine or tobacco in them. These include cigarettes, e-cigarettes, and chewing tobacco. If you need help quitting, ask your doctor. ?Eat heart-healthy foods. Talk with your doctor about the right eating plan for you. ?Exercise regularly as told by your doctor. ?Stay hydrated ?Do not drink alcohol, Caffeine or chocolate. ?Lose weight if you are overweight. ?Do not use drugs, including cannabis ?  ?TAKE AN LOG YOUR BLOOD PRESSURE ? ?Follow-Up: ?Your next appointment:  9-12 month(s) In Person with Donato Heinz, MD  ? ?Please call our office 2 months in advance to schedule this appointment  :1 ? ?At [facility], you and your health needs are our priority.  As part of our continuing mission to provide you with exceptional heart care, we have created designated Provider Care Teams.  These Care Teams include your primary Cardiologist (physician) and Advanced Practice Providers (APPs -  Physician Assistants and Nurse Practitioners) who all work together to provide you with the care you need, when you need it. ? ? ?Important Information About Sugar ? ? ? ? ? ? ?         6 SALTY THINGS TO AVOID     1,'800MG'$  DAILY ? ? ? ? ? ? ?

## 2022-04-18 ENCOUNTER — Other Ambulatory Visit: Payer: Self-pay | Admitting: Cardiology

## 2022-04-18 NOTE — Telephone Encounter (Signed)
Prescription refill request for Eliquis received.  Indication: afib  Last office visit:[place] 03/21/2022 Scr: 0.83, 05/24/2021 Age: 71 yo  Weight: 70.7 kg   Refill sent.

## 2022-06-06 ENCOUNTER — Telehealth: Payer: Self-pay | Admitting: Podiatry

## 2022-06-06 ENCOUNTER — Ambulatory Visit: Payer: Medicare Other | Admitting: Podiatry

## 2022-06-06 ENCOUNTER — Encounter: Payer: Self-pay | Admitting: Podiatry

## 2022-06-06 DIAGNOSIS — M216X1 Other acquired deformities of right foot: Secondary | ICD-10-CM | POA: Diagnosis not present

## 2022-06-06 DIAGNOSIS — B070 Plantar wart: Secondary | ICD-10-CM

## 2022-06-06 DIAGNOSIS — M216X9 Other acquired deformities of unspecified foot: Secondary | ICD-10-CM

## 2022-06-07 ENCOUNTER — Telehealth: Payer: Self-pay | Admitting: Cardiology

## 2022-06-07 ENCOUNTER — Telehealth: Payer: Self-pay | Admitting: Urology

## 2022-06-07 DIAGNOSIS — Z01818 Encounter for other preprocedural examination: Secondary | ICD-10-CM

## 2022-06-07 NOTE — Telephone Encounter (Signed)
   Pre-operative Risk Assessment    Patient Name: Annette Kelley  DOB: 12/10/1950 MRN: [id number]      Request for Surgical Clearance    Procedure:   Metatarsal Osteotomy on  2nd right toe also wide excision of lesion on right toe- a screw will be implanted   Date of Surgery:06-19-22                                   Surgeon:  Dr Ila Mcgill Surgeon's Group or Practice Name:   Phone number:  [phone] Fax number:  [phone]   Type of Clearance Requested:   - Pharmacy:  Hold Apixaban (Eliquis)     Type of Anesthesia:   Choice   Additional requests/questions:    Lorin Glass   06/07/2022, 11:08 AM

## 2022-06-07 NOTE — Telephone Encounter (Signed)
Patient with diagnosis of A fib on Eliquis for anticoagulation.    Procedure: Metatarsal Osteotomy on  2nd right toe also wide excision of lesion on right toe- a screw will be implanted  Date of procedure: 06/19/22   CHA2DS2-VASc Score = 3  {This indicates a 3.2% annual risk of stroke. The patient's score is based upon: CHF History: 0 HTN History: 1 Diabetes History: 0 Stroke History: 0 Vascular Disease History: 0 Age Score: 1 Gender Score: 1   CrCl unknown Platelet count unknown  Patient needs updated BMP and CBC before clearance can be completed

## 2022-06-07 NOTE — Telephone Encounter (Signed)
DOS - 06/19/22  MET OSTEOTOMY 2ND RIGHT --- 09927 EXC. BENIGN LESION RIGHT --- 11426   [employer] EFFECTIVE DATE - 11/19/21  PLAN DEDUCTIBLE - $0.00 OUT OF POCKET - $4,500.00 W/ $4,380.98 REMAINING COINSURANCE - 0% COPAY - $325.00   PER [employer] WEBSITE FOR CPT CODES 80044 AND 71580 Notification or Prior Authorization is not required for the requested services  Decision ID #:[id number]

## 2022-06-07 NOTE — Progress Notes (Signed)
Subjective:   Patient ID: Annette Kelley, female   DOB: 71 y.o.   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  Patient was given consent form which reviewed alternative treatments complications and she absolutely understands there is no guarantees that this will solve the problem and that the chances for reoccurrence of transfer are high on this but given the intensity of discomfort failure to respond conservatively this is the best option we have.  She understands this and after extensive review she signed consent form understanding elevating osteotomy with screw plantar excision and due to the fact will be a plantar incision and we are doing an osteotomy I did dispense air fracture walker today it was fitted properly to her lower leg and all instructions given on its usage and I want her to wear it prior to procedure so she is comfortable with that postoperatively.  Debrided lesion to take pressure off the area and reappoint for surgery with this being scheduled today

## 2022-06-08 NOTE — Telephone Encounter (Signed)
Left message for the pt that she is going to need a tele pre op appt as well as lab work for pre op. I left message to call me so we can set up the tele appt and maybe she can get the labs done the day before the tele appt. Left message to call back and ask to s/w the pre op team.

## 2022-06-08 NOTE — Telephone Encounter (Signed)
Primary Cardiologist:Christopher Fritz Pickerel, MD  Please contact patient to come in for lab work as soon as possible so that anticoagulant clearance can be provided. This will need to be completed prior to phone call appointment.   Preoperative team, please contact this patient and set up a phone call appointment for further preoperative risk assessment. Please obtain consent and complete medication review. Thank you for your help.   Emmaline Life, NP-C    06/08/2022, 8:33 AM [place] [address], [address] Office [phone] Fax [phone]

## 2022-06-11 ENCOUNTER — Telehealth: Payer: Self-pay | Admitting: *Deleted

## 2022-06-11 DIAGNOSIS — Z01818 Encounter for other preprocedural examination: Secondary | ICD-10-CM

## 2022-06-11 NOTE — Telephone Encounter (Signed)
I was able to s/w the pt this morning. Pt is agreeable to plan of care for tele appt and lab work for pre op clearance. Pt will have lab work done today at the [place] office , BMET/CBC. I will place the lab orders. Tele appt set up for 06/14/22 @ 9:40. I will send FYI to requesting office the pt has appt and labs to be done first before she can be cleared. Once cleared we will fax over clearance notes.

## 2022-06-11 NOTE — Telephone Encounter (Signed)
I was able to s/w the pt this morning. Pt is agreeable to plan of care for tele appt and lab work for pre op clearance. Pt will have lab work done today at the [place] office , BMET/CBC. I will place the lab orders. Tele appt set up for 06/14/22 @ 9:40. I will send FYI to requesting office the pt has appt and labs to be done first before she can be cleared. Once cleared we will fax over clearance notes.   Med rec and consent are done.     Patient Consent for Virtual Visit        Annette Kelley has provided verbal consent on 06/11/2022 for a virtual visit (video or telephone).   CONSENT FOR VIRTUAL VISIT FOR:  Annette Kelley  By participating in this virtual visit I agree to the following:  I hereby voluntarily request, consent and authorize [facility] and its employed or contracted physicians, physician assistants, nurse practitioners or other licensed health care professionals (the Practitioner), to provide me with telemedicine health care services (the "Services") as deemed necessary by the treating Practitioner. I acknowledge and consent to receive the Services by the Practitioner via telemedicine. I understand that the telemedicine visit will involve communicating with the Practitioner through live audiovisual communication technology and the disclosure of certain medical information by electronic transmission. I acknowledge that I have been given the opportunity to request an in-person assessment or other available alternative prior to the telemedicine visit and am voluntarily participating in the telemedicine visit.  I understand that I have the right to withhold or withdraw my consent to the use of telemedicine in the course of my care at any time, without affecting my right to future care or treatment, and that the Practitioner or I may terminate the telemedicine visit at any time. I understand that I have the right to inspect all information obtained and/or recorded in the course of the  telemedicine visit and may receive copies of available information for a reasonable fee.  I understand that some of the potential risks of receiving the Services via telemedicine include:  Delay or interruption in medical evaluation due to technological equipment failure or disruption; Information transmitted may not be sufficient (e.g. poor resolution of images) to allow for appropriate medical decision making by the Practitioner; and/or  In rare instances, security protocols could fail, causing a breach of personal health information.  Furthermore, I acknowledge that it is my responsibility to provide information about my medical history, conditions and care that is complete and accurate to the best of my ability. I acknowledge that Practitioner's advice, recommendations, and/or decision may be based on factors not within their control, such as incomplete or inaccurate data provided by me or distortions of diagnostic images or specimens that may result from electronic transmissions. I understand that the practice of medicine is not an exact science and that Practitioner makes no warranties or guarantees regarding treatment outcomes. I acknowledge that a copy of this consent can be made available to me via my patient portal ([facility]), or I can request a printed copy by calling the office of [facility].    I understand that my insurance will be billed for this visit.   I have read or had this consent read to me. I understand the contents of this consent, which adequately explains the benefits and risks of the Services being provided via telemedicine.  I have been provided ample opportunity to ask questions regarding this consent  and the Services and have had my questions answered to my satisfaction. I give my informed consent for the services to be provided through the use of telemedicine in my medical care

## 2022-06-12 LAB — CBC
Hematocrit: 47.5 % — ABNORMAL HIGH (ref 34.0–46.6)
Hemoglobin: 16.7 g/dL — ABNORMAL HIGH (ref 11.1–15.9)
MCH: 32.9 pg (ref 26.6–33.0)
MCHC: 35.2 g/dL (ref 31.5–35.7)
MCV: 94 fL (ref 79–97)
RBC: 5.08 x10E6/uL (ref 3.77–5.28)
RDW: 12.3 % (ref 11.7–15.4)
WBC: 8.6 10*3/uL (ref 3.4–10.8)

## 2022-06-12 LAB — BASIC METABOLIC PANEL
BUN/Creatinine Ratio: 15 (ref 12–28)
BUN: 11 mg/dL (ref 8–27)
CO2: 24 mmol/L (ref 20–29)
Calcium: 9.9 mg/dL (ref 8.7–10.3)
Chloride: 90 mmol/L — ABNORMAL LOW (ref 96–106)
Creatinine, Ser: 0.75 mg/dL (ref 0.57–1.00)
Glucose: 128 mg/dL — ABNORMAL HIGH (ref 70–99)
Potassium: 4.2 mmol/L (ref 3.5–5.2)
Sodium: 131 mmol/L — ABNORMAL LOW (ref 134–144)
eGFR: 86 mL/min/{1.73_m2} (ref >59)

## 2022-06-12 NOTE — Telephone Encounter (Signed)
Patient with diagnosis of A fib on Eliquis for anticoagulation.     Procedure: Metatarsal Osteotomy on  2nd right toe also wide excision of lesion on right toe- a screw will be implanted  Date of procedure: 06/19/22     CHA2DS2-VASc Score = 3  {This indicates a 3.2% annual risk of stroke. The patient's score is based upon: CHF History: 0 HTN History: 1 Diabetes History: 0 Stroke History: 0 Vascular Disease History: 0 Age Score: 1 Gender Score: 1     CrCl 79 mL/min Platelet count unknown   Patient can hold Eliquis for 2 days prior to procedure

## 2022-06-14 ENCOUNTER — Ambulatory Visit: Payer: Medicare Other | Admitting: Nurse Practitioner

## 2022-06-14 DIAGNOSIS — Z01810 Encounter for preprocedural cardiovascular examination: Secondary | ICD-10-CM

## 2022-06-14 NOTE — Progress Notes (Signed)
Virtual Visit via Telephone Note   Because of Annette Kelley's co-morbid illnesses, she is at least at moderate risk for complications without adequate follow up.  This format is felt to be most appropriate for this patient at this time.  The patient did not have access to video technology/had technical difficulties with video requiring transitioning to audio format only (telephone).  All issues noted in this document were discussed and addressed.  No physical exam could be performed with this format.  Please refer to the patient's chart for her consent to telehealth for [facility].  Evaluation Performed:  Preoperative cardiovascular risk assessment _____________   Date:  06/14/2022   Patient ID:  Annette Kelley, DOB July 19, 1951, MRN [id number] Patient Location:  Home Provider location:   Office  Primary Care Provider:  Bartholome Bill, MD Primary Cardiologist:  Donato Heinz, MD  Chief Complaint / Patient Profile   71 y.o. y/o female with a h/o persistent atrial fibrillation, aortic valve regurgitation, hypertension, and hyperlipidemia who is pending metatarsal osteotomy on 2nd R toe and wide excision of lesion on R toe with screw implant on 06/19/2022 with Dr. Ila Mcgill and presents today for telephonic preoperative cardiovascular risk assessment.  Past Medical History    Past Medical History:  Diagnosis Date   High cholesterol    Hypertension    Past Surgical History:  Procedure Laterality Date   APPENDECTOMY     CARDIOVERSION N/A 06/01/2021   Procedure: CARDIOVERSION;  Surgeon: Josue Hector, MD;  Location: [facility] ENDOSCOPY;  Service: Cardiovascular;  Laterality: N/A;   TUBAL LIGATION      Allergies  Allergies  Allergen Reactions   Other Anaphylaxis    Cannot lick envelopes - feels like throat is closing    Sulfa Antibiotics Itching    History of Present Illness    Annette Kelley is a 71 y.o. female who presents via audio/video conferencing for  a telehealth visit today.  Pt was last seen in cardiology clinic on 03/21/2022 by Coletta Memos, NP.  At that time STEFFANIE MINGLE was doing well.  The patient is now pending procedure as outlined above. Since her last visit, she has done well from a cardiac standpoint. She denies chest pain, palpitations, dyspnea, pnd, orthopnea, n, v, dizziness, syncope, edema, weight gain, or early satiety. All other systems reviewed and are otherwise negative except as noted above.   Home Medications    Prior to Admission medications   Medication Sig Start Date End Date Taking? Authorizing Provider  atorvastatin (LIPITOR) 20 MG tablet Take 1 tablet by mouth daily. 12/29/21   [provider]  diclofenac Sodium (VOLTAREN) 1 % GEL Apply 1 application topically daily as needed (pain).    [provider]  ELIQUIS 5 MG TABS tablet TAKE 1 TABLET(5 MG) BY MOUTH TWICE DAILY 04/18/22   Donato Heinz, MD  hydrochlorothiazide (HYDRODIURIL) 25 MG tablet Take 25 mg by mouth every other day.    [provider]  meloxicam (MOBIC) 7.5 MG tablet Take 7.5 mg by mouth daily as needed for pain. 02/22/21   [provider]  metoprolol succinate (TOPROL-XL) 50 MG 24 hr tablet Take 50 mg by mouth daily. Take with or immediately following a meal.    [provider]  telmisartan (MICARDIS) 80 MG tablet Take 80 mg by mouth daily.    [provider]    Physical Exam    Vital Signs:  ELISABELLA HACKER does not have  vital signs available for review today.  Given telephonic nature of communication, physical exam is limited. AAOx3. NAD. Normal affect.  Speech and respirations are unlabored.  Accessory Clinical Findings    None  Assessment & Plan    1.  Preoperative Cardiovascular Risk Assessment:  According to the Revised Cardiac Risk Index (RCRI), her Perioperative Risk of Major Cardiac Event is (%): 0.4. Her Functional Capacity in METs is: 6.79 according to the [facility] Activity  Status Index (DASI). Therefore, based on ACC/AHA guidelines, patient would be at acceptable risk for the planned procedure without further cardiovascular testing.   Patient with diagnosis of A fib on Eliquis for anticoagulation.     Procedure: Metatarsal Osteotomy on 2nd right toe also wide excision of lesion on right toe- a screw will be implanted  Date of procedure: 06/19/22     CHA2DS2-VASc Score = 3  {This indicates a 3.2% annual risk of stroke. The patient's score is based upon: CHF History: 0 HTN History: 1 Diabetes History: 0 Stroke History: 0 Vascular Disease History: 0 Age Score: 1 Gender Score: 1     CrCl 79 mL/min Platelet count unknown   Patient can hold Eliquis for 2 days prior to procedure.  Please resume Eliquis as soon as possible postprocedure, at the discretion of the surgeon.  A copy of this note will be routed to requesting surgeon.  Time:   Today, I have spent 5 minutes with the patient with telehealth technology discussing medical history, symptoms, and management plan.     Lenna Sciara, NP  06/14/2022, 9:52 AM

## 2022-06-18 MED ORDER — HYDROCODONE-ACETAMINOPHEN 10-325 MG PO TABS: 1.0000 | ORAL_TABLET | Freq: Three times a day (TID) | ORAL | 0 refills | Status: AC | PRN

## 2022-06-18 NOTE — Addendum Note (Signed)
Addended by: Wallene Huh on: 06/18/2022 01:47 PM   Modules accepted: Orders

## 2022-06-19 ENCOUNTER — Encounter: Payer: Self-pay | Admitting: Podiatry

## 2022-06-19 DIAGNOSIS — D492 Neoplasm of unspecified behavior of bone, soft tissue, and skin: Secondary | ICD-10-CM | POA: Diagnosis not present

## 2022-06-19 DIAGNOSIS — M21541 Acquired clubfoot, right foot: Secondary | ICD-10-CM | POA: Diagnosis not present

## 2022-06-19 DIAGNOSIS — D2371 Other benign neoplasm of skin of right lower limb, including hip: Secondary | ICD-10-CM | POA: Diagnosis not present

## 2022-06-25 ENCOUNTER — Encounter: Payer: Self-pay | Admitting: Podiatry

## 2022-06-25 ENCOUNTER — Ambulatory Visit (INDEPENDENT_AMBULATORY_CARE_PROVIDER_SITE_OTHER): Payer: Medicare Other | Admitting: Podiatry

## 2022-06-25 ENCOUNTER — Ambulatory Visit (INDEPENDENT_AMBULATORY_CARE_PROVIDER_SITE_OTHER): Payer: Medicare Other

## 2022-06-25 DIAGNOSIS — M21541 Acquired clubfoot, right foot: Secondary | ICD-10-CM

## 2022-06-25 DIAGNOSIS — Z9889 Other specified postprocedural states: Secondary | ICD-10-CM | POA: Diagnosis not present

## 2022-06-25 NOTE — Progress Notes (Signed)
Subjective:   Patient ID: Annette Kelley, female   DOB: 71 y.o.   MRN: [id number]   HPI Patient states doing very well with surgery with minimal discomfort   ROS      Objective:  Physical Exam  Neurovascular status intact right foot healing well wound edges well coapted stitches in place plantarly from removal of lesion     Assessment:  Well post foot surgery right     Plan:  H&P x-rays reviewed sterile dressing reapplied continue immobilization elevation reappoint 2 weeks suture removal and then would be seen 4 weeks after that and can start to resume normal shoe gear.  Encouraged to call questions concerns  X-rays indicate that the screw is in place good alignment noted

## 2022-07-11 ENCOUNTER — Ambulatory Visit: Payer: Medicare Other | Admitting: Podiatry

## 2022-07-11 ENCOUNTER — Encounter: Payer: Self-pay | Admitting: Podiatry

## 2022-07-11 DIAGNOSIS — Z9889 Other specified postprocedural states: Secondary | ICD-10-CM

## 2022-07-11 NOTE — Progress Notes (Signed)
Subjective:   Patient ID: Annette Kelley, female   DOB: 71 y.o.   MRN: [id number]   HPI Patient states doing great with surgery very pleased minimal discomfort   ROS      Objective:  Physical Exam  Neurovascular status intact negative Bevelyn Buckles' sign noted wound edges well coapted good alignment of the metatarsal     Assessment:  Doing well post osteotomy removal of plantar lesion with wide skin excision     Plan:  Stitches removed wound edges remain coapted well gradual return to soft shoe gear over the next couple weeks dispense ankle compression stocking reappoint as needed

## 2022-10-11 ENCOUNTER — Other Ambulatory Visit: Payer: Self-pay | Admitting: Cardiology

## 2022-10-11 DIAGNOSIS — I4819 Other persistent atrial fibrillation: Secondary | ICD-10-CM

## 2022-10-15 NOTE — Telephone Encounter (Signed)
Eliquis '5mg'$  refill request received. Patient is 71 years old, weight-70.7kg, Crea-0.75 on 06/11/2022, Diagnosis-Afib, and last seen by Diona Browner on 06/14/2022. Dose is appropriate based on dosing criteria. Will send in refill to requested pharmacy.

## 2023-04-11 ENCOUNTER — Other Ambulatory Visit: Payer: Self-pay | Admitting: Cardiology

## 2023-04-11 DIAGNOSIS — I4819 Other persistent atrial fibrillation: Secondary | ICD-10-CM

## 2023-04-11 NOTE — Telephone Encounter (Signed)
Prescription refill request for Eliquis received. Indication: afib  Last office visit: [place], 06/14/2022 Scr: 0.72, 02/07/2023 Age: 72 yo  Weight: 70.7 kg   Refill sent.

## 2023-04-21 LAB — COLOGUARD: COLOGUARD: NEGATIVE

## 2023-06-30 ENCOUNTER — Ambulatory Visit
Admission: EM | Admit: 2023-06-30 | Discharge: 2023-06-30 | Disposition: A | Discharge status: Home / Self Care | Payer: Medicare Other

## 2023-06-30 ENCOUNTER — Encounter: Payer: Self-pay | Admitting: Emergency Medicine

## 2023-06-30 ENCOUNTER — Emergency Department (HOSPITAL_COMMUNITY)
Admission: EM | Admit: 2023-06-30 | Discharge: 2023-06-30 | Disposition: A | Discharge status: Home / Self Care | Payer: Medicare Other | Source: Home / Self Care | Attending: Emergency Medicine | Admitting: Emergency Medicine

## 2023-06-30 ENCOUNTER — Other Ambulatory Visit: Payer: Self-pay

## 2023-06-30 DIAGNOSIS — Z7901 Long term (current) use of anticoagulants: Secondary | ICD-10-CM | POA: Insufficient documentation

## 2023-06-30 DIAGNOSIS — X58XXXA Exposure to other specified factors, initial encounter: Secondary | ICD-10-CM | POA: Diagnosis not present

## 2023-06-30 DIAGNOSIS — R000 Tachycardia, unspecified: Secondary | ICD-10-CM | POA: Diagnosis not present

## 2023-06-30 DIAGNOSIS — I4891 Unspecified atrial fibrillation: Secondary | ICD-10-CM | POA: Diagnosis not present

## 2023-06-30 DIAGNOSIS — S79921A Unspecified injury of right thigh, initial encounter: Secondary | ICD-10-CM | POA: Diagnosis present

## 2023-06-30 DIAGNOSIS — M7989 Other specified soft tissue disorders: Secondary | ICD-10-CM | POA: Diagnosis not present

## 2023-06-30 DIAGNOSIS — R2241 Localized swelling, mass and lump, right lower limb: Secondary | ICD-10-CM | POA: Diagnosis not present

## 2023-06-30 DIAGNOSIS — S7011XA Contusion of right thigh, initial encounter: Secondary | ICD-10-CM | POA: Diagnosis not present

## 2023-06-30 DIAGNOSIS — Z79899 Other long term (current) drug therapy: Secondary | ICD-10-CM | POA: Diagnosis not present

## 2023-06-30 DIAGNOSIS — R58 Hemorrhage, not elsewhere classified: Secondary | ICD-10-CM | POA: Diagnosis not present

## 2023-06-30 DIAGNOSIS — D689 Coagulation defect, unspecified: Secondary | ICD-10-CM

## 2023-06-30 LAB — BASIC METABOLIC PANEL
Anion gap: 12 (ref 5–15)
BUN: 12 mg/dL (ref 8–23)
CO2: 24 mmol/L (ref 22–32)
Calcium: 9.6 mg/dL (ref 8.9–10.3)
Chloride: 92 mmol/L — ABNORMAL LOW (ref 98–111)
Creatinine, Ser: 0.67 mg/dL (ref 0.44–1.00)
GFR, Estimated: >60 mL/min (ref >60)
Glucose, Bld: 105 mg/dL — ABNORMAL HIGH (ref 70–99)
Potassium: 3.5 mmol/L (ref 3.5–5.1)
Sodium: 128 mmol/L — ABNORMAL LOW (ref 135–145)

## 2023-06-30 LAB — CBC
HCT: 40.3 % (ref 36.0–46.0)
Hemoglobin: 14 g/dL (ref 12.0–15.0)
MCH: 32.6 pg (ref 26.0–34.0)
MCHC: 34.7 g/dL (ref 30.0–36.0)
MCV: 93.7 fL (ref 80.0–100.0)
Platelets: 216 10*3/uL (ref 150–400)
RBC: 4.3 MIL/uL (ref 3.87–5.11)
RDW: 13.2 % (ref 11.5–15.5)
WBC: 12.2 10*3/uL — ABNORMAL HIGH (ref 4.0–10.5)
nRBC: 0 % (ref 0.0–0.2)

## 2023-06-30 NOTE — ED Provider Notes (Signed)
[facility]    CSN: [id number] Arrival date & time: 06/30/23  4098      History   Chief Complaint Chief Complaint  Patient presents with   Leg Swelling    HPI Annette Kelley is a 72 y.o. female.   Patient presents with right leg swelling and pain that started about a week ago.  Reports that she saw her primary care on 06/26/2023 but they were not sure exactly what was wrong so they ordered an ultrasound to rule out DVT which is not scheduled until 8/22.  States that they gave her a steroid shot and prescribed her muscle relaxers which provided minimal improvement.  She noticed about 5 days ago that bruising was developing throughout her leg.  She denies any obvious injuries or falls.  She does take Eliquis.     Past Medical History:  Diagnosis Date   High cholesterol    Hypertension     Patient Active Problem List   Diagnosis Date Noted   Actinic keratosis 01/08/2022   Dry skin 01/08/2022   Lentigo 01/08/2022   Neoplasm of uncertain behavior of skin 01/08/2022   Low sodium levels 08/05/2020   Dizziness 05/11/2015   Excessive thirst 05/11/2015   Right lumbar radiculopathy 09/15/2014   History of hypothyroidism 07/09/2014   Hypothyroidism 07/09/2014   SVT (supraventricular tachycardia) 04/08/2014   Hyperlipidemia 03/09/2014   DDD (degenerative disc disease), lumbosacral 04/25/2013   Essential hypertension 04/25/2013    Past Surgical History:  Procedure Laterality Date   APPENDECTOMY     CARDIOVERSION N/A 06/01/2021   Procedure: CARDIOVERSION;  Surgeon: Wendall Stade, MD;  Location: [facility] ENDOSCOPY;  Service: Cardiovascular;  Laterality: N/A;   TUBAL LIGATION      OB History   No obstetric history on file.      Home Medications    Prior to Admission medications   Medication Sig Start Date End Date Taking? Authorizing Provider  tiZANidine (ZANAFLEX) 2 MG tablet Take by mouth. 06/26/23 07/06/23 Yes [provider]  atorvastatin (LIPITOR)  20 MG tablet Take 1 tablet by mouth daily. 12/29/21   [provider]  diclofenac Sodium (VOLTAREN) 1 % GEL Apply 1 application topically daily as needed (pain).    [provider]  ELIQUIS 5 MG TABS tablet TAKE 1 TABLET(5 MG) BY MOUTH TWICE DAILY 04/11/23   Little Ishikawa, MD  hydrochlorothiazide (HYDRODIURIL) 25 MG tablet Take 25 mg by mouth every other day.    [provider]  meloxicam (MOBIC) 7.5 MG tablet Take 7.5 mg by mouth daily as needed for pain. Patient not taking: Reported on 06/30/2023 02/22/21   [provider]  metoprolol succinate (TOPROL-XL) 50 MG 24 hr tablet Take 50 mg by mouth daily. Take with or immediately following a meal.    [provider]  telmisartan (MICARDIS) 80 MG tablet Take 80 mg by mouth daily.    [provider]    Family History Family History  Problem Relation Age of Onset   Diabetes Mother    Cancer Mother    Hypertension Mother     Social History Social History   Tobacco Use   Smoking status: Every Day    Current packs/day: 1.00    Types: Cigarettes   Smokeless tobacco: Never  Vaping Use   Vaping status: Never Used  Substance Use Topics   Alcohol use: Yes    Alcohol/week: 2.0 standard drinks of alcohol    Types: 2 Cans of beer per  week   Drug use: Never     Allergies   Other and Sulfa antibiotics   Review of Systems Review of Systems Per HPI  Physical Exam Triage Vital Signs ED Triage Vitals  Encounter Vitals Group     BP 06/30/23 0912 138/87     Systolic BP Percentile --      Diastolic BP Percentile --      Pulse Rate 06/30/23 0912 99     Resp 06/30/23 0912 20     Temp 06/30/23 0912 97.7 F (36.5 C)     Temp Source 06/30/23 0912 Oral     SpO2 06/30/23 0912 96 %     Weight --      Height --      Head Circumference --      Peak Flow --      Pain Score 06/30/23 0909 3     Pain Loc --      Pain Education --      Exclude from Growth Chart --    No data  found.  Updated Vital Signs BP 138/87 (BP Location: Left Arm)   Pulse 99   Temp 97.7 F (36.5 C) (Oral)   Resp 20   SpO2 96%   Visual Acuity Right Eye Distance:   Left Eye Distance:   Bilateral Distance:    Right Eye Near:   Left Eye Near:    Bilateral Near:     Physical Exam Constitutional:      General: She is not in acute distress.    Appearance: Normal appearance. She is not toxic-appearing or diaphoretic.  HENT:     Head: Normocephalic and atraumatic.  Eyes:     Extraocular Movements: Extraocular movements intact.     Conjunctiva/sclera: Conjunctivae normal.  Pulmonary:     Effort: Pulmonary effort is normal.  Skin:    Comments: Patient has mild swelling present throughout upper thigh that extends to ankle.  Patient has bruising discoloration present to lower anterior thigh that extends to posterior thigh.  Additional bruising noted to lateral right lower leg.  Patient's leg is cool to the touch and pedal pulses are very faint.  Unable to adequately assess capillary refill.  Mild tenderness to palpation as well.  Neurological:     General: No focal deficit present.     Mental Status: She is alert and oriented to person, place, and time. Mental status is at baseline.  Psychiatric:        Mood and Affect: Mood normal.        Behavior: Behavior normal.        Thought Content: Thought content normal.        Judgment: Judgment normal.      UC Treatments / Results  Labs (all labs ordered are listed, but only abnormal results are displayed) Labs Reviewed - No data to display  EKG   Radiology No results found.  Procedures Procedures (including critical care time)  Medications Ordered in UC Medications - No data to display  Initial Impression / Assessment and Plan / UC Course  I have reviewed the triage vital signs and the nursing notes.  Pertinent labs & imaging results that were available during my care of the patient were reviewed by me and considered in  my medical decision making (see chart for details).     I am concerned with significant bruising and swelling the patient may have a large hematoma versus DVT.  There is a large concern for hematoma  given the patient takes Eliquis.  I do think that more advanced imaging and CBC is necessary which cannot provided here at urgent care so patient was advised to go to the emergency department for further evaluation and management.  She was agreeable with this plan.  Vital signs stable at discharge.  Agree with patient self transport to the ER. Final Clinical Impressions(s) / UC Diagnoses   Final diagnoses:  Right leg swelling     Discharge Instructions      Please go to the emergency department as soon as you leave urgent care for further evaluation and management.     ED Prescriptions   None    PDMP not reviewed this encounter.   Gustavus Bryant, [place] 06/30/23 [phone]

## 2023-06-30 NOTE — ED Notes (Signed)
Patient is being discharged from the Urgent Care and sent to the Emergency Department via POV . Per Ervin Knack, NP, patient is in need of higher level of care due to limited resources of the urgent care setting. Patient is aware and verbalizes understanding of plan of care.  Vitals:   06/30/23 0912  BP: 138/87  Pulse: 99  Resp: 20  Temp: 97.7 F (36.5 C)  SpO2: 96%

## 2023-06-30 NOTE — ED Triage Notes (Signed)
Pt arrived via POV. C/o bruise, soreness, and swelling to R thigh. No known injury.  AOx4

## 2023-06-30 NOTE — ED Provider Notes (Signed)
[facility] Provider Note   CSN: [id number] Arrival date & time: 06/30/23  1034     History  Chief Complaint  Patient presents with   bruise   Leg Swelling    Annette Kelley is a 72 y.o. female.  HPI 72 yo female on eliquis for a fib rvr now with bruisint to rle without injury.  Patient noted bruising since WEdnessday.  Saw pmd for leg thigh discomfort felt like muscle strain.  Started on muscle relaxant.  Noticed bruising Wednesday.  PMD had ordered op labs and doppler     Home Medications Prior to Admission medications   Medication Sig Start Date End Date Taking? Authorizing Provider  atorvastatin (LIPITOR) 20 MG tablet Take 1 tablet by mouth daily. 12/29/21   [provider]  diclofenac Sodium (VOLTAREN) 1 % GEL Apply 1 application topically daily as needed (pain).    [provider]  ELIQUIS 5 MG TABS tablet TAKE 1 TABLET(5 MG) BY MOUTH TWICE DAILY 04/11/23   Little Ishikawa, MD  hydrochlorothiazide (HYDRODIURIL) 25 MG tablet Take 25 mg by mouth every other day.    [provider]  meloxicam (MOBIC) 7.5 MG tablet Take 7.5 mg by mouth daily as needed for pain. Patient not taking: Reported on 06/30/2023 02/22/21   [provider]  metoprolol succinate (TOPROL-XL) 50 MG 24 hr tablet Take 50 mg by mouth daily. Take with or immediately following a meal.    [provider]  telmisartan (MICARDIS) 80 MG tablet Take 80 mg by mouth daily.    [provider]  tiZANidine (ZANAFLEX) 2 MG tablet Take by mouth. 06/26/23 07/06/23  [provider]      Allergies    Other and Sulfa antibiotics    Review of Systems   Review of Systems  Physical Exam Updated Vital Signs BP 114/72   Pulse 93   Temp 97.6 F (36.4 C) (Oral)   Resp (!) 21   Ht 1.575 m (5\' 2" )   Wt 72.6 kg   SpO2 99%   BMI 29.26 kg/m  Physical Exam Vitals and nursing note reviewed.  Constitutional:       General: She is not in acute distress.    Appearance: She is well-developed.  HENT:     Head: Normocephalic and atraumatic.     Right Ear: External ear normal.     Left Ear: External ear normal.     Nose: Nose normal.  Eyes:     Conjunctiva/sclera: Conjunctivae normal.     Pupils: Pupils are equal, round, and reactive to light.  Cardiovascular:     Rate and Rhythm: Tachycardia present.  Pulmonary:     Effort: Pulmonary effort is normal.  Abdominal:     General: Abdomen is flat. Bowel sounds are normal.  Musculoskeletal:        General: Normal range of motion.     Cervical back: Normal range of motion and neck supple.  Skin:    General: Skin is warm and dry.  Neurological:     Mental Status: She is alert and oriented to person, place, and time.     Motor: No abnormal muscle tone.     Coordination: Coordination normal.  Psychiatric:        Behavior: Behavior normal.        Thought Content: Thought content normal.     ED Results / Procedures / Treatments   Labs (all labs ordered are listed,  but only abnormal results are displayed) Labs Reviewed  CBC - Abnormal; Notable for the following components:      Result Value   WBC 12.2 (*)    All other components within normal limits  BASIC METABOLIC PANEL - Abnormal; Notable for the following components:   Sodium 128 (*)    Chloride 92 (*)    Glucose, Bld 105 (*)    All other components within normal limits    EKG EKG Interpretation Date/Time:  Sunday June 30 2023 13:02:19 EDT Ventricular Rate:  88 PR Interval:    QRS Duration:  87 QT Interval:  369 QTC Calculation: 447 R Axis:   58  Text Interpretation: Atrial fibrillation Ventricular premature complex Low voltage, precordial leads Probable anteroseptal infarct, old Confirmed by Margarita Grizzle [phone]) on 06/30/2023 1:39:20 PM  Radiology No results found.  Procedures Procedures    Medications Ordered in ED Medications - No data to display  ED Course/ Medical  Decision Making/ A&P Clinical Course as of 06/30/23 1342  Sun Jun 30, 2023  1339 CBC reviewed interpreted significant for leukocytosis 12,200 otherwise hemoglobin is normal and platelets are normal [DR]  1339 Basic metabolic panel reviewed interpreted significant for mild hyponatremia sodium 128 which has trended down from low 130s previously [DR]    Clinical Course User Index [DR] Margarita Grizzle, MD                                 Medical Decision Making Amount and/or Complexity of Data Reviewed Labs: ordered.   72 year old female with atraumatic bruising to right leg, patient is on Eliquis. Patient with recorded heart rate of 114 but normal on my exam and EKG obtained shows rate in the 80s Patient evaluated here with labs including CBC Low index of suspicion for DVT as there is no palpable swelling and patient is already on Eliquis Bleeding with out trauma on Eliquis.  Patient does not have any other reports of bleeding, denies hematuria or hemoptysis.  Patient's hemoglobin and platelets are normal Given ongoing risk for stroke with A-fib will continue Eliquis Patient improved advised regarding conservative therapy and will be given Ace wrap here in ED. She is advised to follow-up with her primary care physician we have discussed return precautions need for follow-up for this is understanding.        Final Clinical Impression(s) / ED Diagnoses Final diagnoses:  Anticoagulant-induced bleeding [facility])    Rx / DC Orders ED Discharge Orders     None         Margarita Grizzle, MD 06/30/23 1342

## 2023-06-30 NOTE — Discharge Instructions (Signed)
Please elevate leg and use mild compression and warmth as needed As bruise breaks down, it will likely spread to the lower leg Please see your doctor for reevaluation this week Return if you are having any new or worsening symptoms especially increased pain or swelling

## 2023-06-30 NOTE — ED Triage Notes (Signed)
8/2 noticed soreness of right thigh ( buttocks to knee) and though it to be swollen. 8/14 saw pcp and no diagnosis.  Has sonogram coming up on 8/22.  Then the bruise started showing after seeing pcp that day.  Bruise was not present for pcp appt.    Patient denies any fall.  Denies any injury.  Patient has used ice and heat.   Bilateral dp pulses palpable, but faint.  Feet equally cool to touch

## 2023-06-30 NOTE — Discharge Instructions (Signed)
Please go to the emergency department as soon as you leave urgent care for further evaluation and management. ?

## 2023-11-10 ENCOUNTER — Other Ambulatory Visit: Payer: Self-pay | Admitting: Cardiology

## 2023-11-10 DIAGNOSIS — I4819 Other persistent atrial fibrillation: Secondary | ICD-10-CM

## 2023-11-11 NOTE — Telephone Encounter (Signed)
Prescription refill request for Eliquis received. Indication:afib Last office visit:needs appt Scr:0.67  8/24 Age: 72 Weight:72.6  kg  Prescription refilled

## 2023-11-24 NOTE — Progress Notes (Signed)
 Cardiology Office Note:    Date:  11/28/2023   ID:  Annette Kelley, DOB 10-May-1951, MRN [id number]  PCP:  Jolee Madelin Patch, MD  Cardiologist:  Lonni LITTIE Nanas, MD  Electrophysiologist:  None   Referring MD: Jolee Madelin Patch, MD   Chief Complaint  Patient presents with   Atrial Fibrillation     History of Present Illness:    Annette Kelley is a 73 y.o. female with a hx of atrial fibrillation, hypertension, hyperlipidemia who presents for follow-up.  She was referred by Dr. Jolee for evaluation of atrial fibrillation, initially seen on 03/31/2021.  She was seen by Dr. Jolee on 02/22/2021.  Found to be in new atrial fibrillation, rate 100s.  Echocardiogram in 04/25/2021 showed normal biventricular function, mild to moderate aortic regurgitation.  Underwent successful cardioversion on 06/01/2021.  Since last clinic visit, she reports she is doing well.  Denies any chest pain, dyspnea, lightheadedness, syncope, lower extremity edema, or palpitations.  Denies any bleeding on Eliquis .  She is smoking 0.5 to 0.75 packs/day.   Past Medical History:  Diagnosis Date   High cholesterol    Hypertension     Past Surgical History:  Procedure Laterality Date   APPENDECTOMY     CARDIOVERSION N/A 06/01/2021   Procedure: CARDIOVERSION;  Surgeon: Delford Maude BROCKS, MD;  Location: [facility];  Service: Cardiovascular;  Laterality: N/A;   TUBAL LIGATION      Current Medications: Current Meds  Medication Sig   apixaban  (ELIQUIS ) 5 MG TABS tablet Take 1 tablet (5 mg total) by mouth 2 (two) times daily. Needs Cardiology appt for Eliquis  Refills, call office   atorvastatin (LIPITOR) 20 MG tablet Take 1 tablet by mouth daily.   hydrochlorothiazide (HYDRODIURIL) 25 MG tablet Take 25 mg by mouth daily.   metoprolol  succinate (TOPROL -XL) 50 MG 24 hr tablet Take 50 mg by mouth daily. Take with or immediately following a meal.   telmisartan (MICARDIS) 80 MG tablet Take 80 mg by mouth daily.      Allergies:   Other and Sulfa antibiotics   Social History   Socioeconomic History   Marital status: Single    Spouse name: Not on file   Number of children: Not on file   Years of education: Not on file   Highest education level: Not on file  Occupational History   Not on file  Tobacco Use   Smoking status: Every Day    Current packs/day: 1.00    Types: Cigarettes   Smokeless tobacco: Never  Vaping Use   Vaping status: Never Used  Substance and Sexual Activity   Alcohol use: Yes    Alcohol/week: 2.0 standard drinks of alcohol    Types: 2 Cans of beer per week   Drug use: Never   Sexual activity: Not Currently  Other Topics Concern   Not on file  Social History Narrative   Not on file   Social Drivers of Health   Financial Resource Strain: Low Risk  (02/05/2020)   Received from [facility] visits prior to 01/19/2023., [facility] visits prior to 01/19/2023.   Overall Financial Resource Strain (CARDIA)    Difficulty of Paying Living Expenses: Not very hard  Food Insecurity: Low Risk  (07/17/2023)   [facility]   Hunger Vital Sign    Worried About Running Out of Food in the Last Year: Never true    Ran Out of Food in the Last Year:  Never true  Transportation Needs: No Transportation Needs (07/17/2023)   [facility]    In the past 12 months, has lack of reliable transportation kept you from medical appointments, meetings, work or from getting things needed for daily living? : No  Physical Activity: Not on file  Stress: No Stress Concern Present (02/05/2020)   [facility] from [facility] visits prior to 01/19/2023., [facility] visits prior to 01/19/2023.   [employer] of Occupational Health - Occupational Stress Questionnaire    Feeling of Stress : Not at all  Social Connections: Socially Isolated (02/05/2020)   [facility] visits prior to 01/19/2023., [facility] visits prior to 01/19/2023.   Social Connection and Isolation Panel [NHANES]    Frequency of Communication with Friends and Family: More than three times a week    Frequency of Social Gatherings with Friends and Family: Three times a week    Attends Religious Services: Never    Active Member of Clubs or Organizations: No    Attends Banker Meetings: Never    Marital Status: Widowed     Family History: The patient's family history includes Cancer in her mother; Diabetes in her mother; Hypertension in her mother.  ROS:   Please see the history of present illness.     (+) All other systems reviewed and are negative.  EKGs/Labs/Other Studies Reviewed:    The following studies were reviewed today: ECHO 04/25/21:   IMPRESSIONS    1. The aortic valve is tricuspid. There is mild calcification of the  aortic valve. There is moderate thickening of the aortic valve. Aortic  valve regurgitation is mild to moderate. Mild aortic valve sclerosis is  present, with no evidence of aortic valve   stenosis.   2. Left ventricular ejection fraction, by estimation, is 55 to 60%. The  left ventricle has normal function. The left ventricle has no regional  wall motion abnormalities. Left ventricular diastolic parameters are  indeterminate.   3. Right ventricular systolic function is normal. The right ventricular  size is normal. Tricuspid regurgitation signal is inadequate for assessing  PA pressure.   4. The mitral valve is normal in structure. Trivial mitral valve  regurgitation. No evidence of mitral stenosis.   5. The inferior vena cava is normal in size with greater than 50%  respiratory variability, suggesting right atrial pressure of 3 mmHg.   EKG:   11/28/2023: Atrial fibrillation, rate 91, low voltage 06/07/21: sinus rhythm, rate 68 bpm, poor r-wave progression 04/25/2021: Atrial Fibrillation, rate  90, poor R wave progression 03/31/2021: Atrial fibrillation, rate 99 bpm, No ST abnormalities  Recent Labs: 06/30/2023: BUN 12; Creatinine, Ser 0.67; Hemoglobin 14.0; Platelets 216; Potassium 3.5; Sodium 128  Recent Lipid Panel No results found for: CHOL, TRIG, HDL, CHOLHDL, VLDL, LDLCALC, LDLDIRECT  Physical Exam:    VS:  BP (!) 139/90 (BP Location: Left Arm, Patient Position: Sitting, Cuff Size: Normal)   Pulse 91   Ht 5' 2 (1.575 m)   Wt 156 lb (70.8 kg)   SpO2 97%   BMI 28.53 kg/m     Wt Readings from Last 3 Encounters:  11/28/23 156 lb (70.8 kg)  06/30/23 160 lb (72.6 kg)  03/21/22 155 lb 12.8 oz (70.7 kg)     GEN: Well nourished, well developed in no acute distress HEENT: Normal NECK: No JVD; No carotid bruits LYMPHATICS: No lymphadenopathy  CARDIAC: RRR, no murmurs, rubs, gallops RESPIRATORY:  Clear to auscultation without rales, wheezing or rhonchi  ABDOMEN: Soft, non-tender, non-distended MUSCULOSKELETAL:  No edema; No deformity  SKIN: Warm and dry NEUROLOGIC:  Alert and oriented x 3 PSYCHIATRIC:  Normal affect   ASSESSMENT:    1. Permanent atrial fibrillation (HCC)   2. Essential hypertension   3. Aortic valve insufficiency, etiology of cardiac valve disease unspecified   4. Hyperlipidemia, unspecified hyperlipidemia type   5. Tobacco use      PLAN:    Permanent atrial fibrillation: CHA2DS2-VASc score 3 (hypertension, age, female).  Echocardiogram in 04/25/2021 showed normal biventricular function, mild to moderate aortic regurgitation.  Underwent successful cardioversion on 06/01/2021.  Was in A-fib on follow-up 03/2022, appears has been in A-fib since that time, suspect permanent A-fib at this point -Continue toprol  XL 50 mg daily -Continue Eliquis  5 mg twice daily -Sleep study recommended but she declines at this time  Aortic regurgitation: Mild to moderate AI on echo 04/25/2021.  Will monitor, recommend repeat echocardiogram   Hypertension:  On hydrochlorothiazide 25 mg daily and telmisartan 80 mg daily.  BP mildly elevated in clinic today but reports has been under good control at home.  Asked to check BP twice daily for next week and let us  know results   Hyperlipidemia: On atorvastatin 20 mg daily. LDL 70 on 02/23/2021.  Check lipid panel  Tobacco use: Counseledon  risk of tobacco use and cessation strongly encouraged   RTC in 6 months   Medication Adjustments/Labs and Tests Ordered: Current medicines are reviewed at length with the patient today.  Concerns regarding medicines are outlined above.  Orders Placed This Encounter  Procedures   Magnesium   Lipid panel   Comprehensive metabolic panel   CBC w/Diff/Platelet   EKG 12-Lead   ECHOCARDIOGRAM COMPLETE    No orders of the defined types were placed in this encounter.    Patient Instructions  Medication Instructions:  Continue current medication *If you need a refill on your cardiac medications before your next appointment, please call your pharmacy*   Lab Work: Cmet, mg, cbc, lipid panel If you have labs (blood work) drawn today and your tests are completely normal, you will receive your results only by: MyChart Message (if you have MyChart) OR A paper copy in the mail If you have any lab test that is abnormal or we need to change your treatment, we will call you to review the results.   Testing/Procedures: Echo  Your physician has requested that you have an echocardiogram. Echocardiography is a painless test that uses sound waves to create images of your heart. It provides your doctor with information about the size and shape of your heart and how well your heart's chambers and valves are working. This procedure takes approximately one hour. There are no restrictions for this procedure. Please do NOT wear cologne, perfume, aftershave, or lotions (deodorant is allowed). Please arrive 15 minutes prior to your appointment time.  Please note: We ask at that  you not bring children with you during ultrasound (echo/ vascular) testing. Due to room size and safety concerns, children are not allowed in the ultrasound rooms during exams. Our front office staff cannot provide observation of children in our lobby area while testing is being conducted. An adult accompanying a patient to their appointment will only be allowed in the ultrasound room at the discretion of the ultrasound technician under special circumstances. We apologize for any inconvenience.    Follow-Up: At  [facility], you and your health needs are our priority.  As part of our continuing mission to provide you with exceptional heart care, we have created designated Provider Care Teams.  These Care Teams include your primary Cardiologist (physician) and Advanced Practice Providers (APPs -  Physician Assistants and Nurse Practitioners) who all work together to provide you with the care you need, when you need it.  We recommend signing up for the patient portal called MyChart.  Sign up information is provided on this After Visit Summary.  MyChart is used to connect with patients for Virtual Visits (Telemedicine).  Patients are able to view lab/test results, encounter notes, upcoming appointments, etc.  Non-urgent messages can be sent to your provider as well.   To learn more about what you can do with MyChart, go to [email or web address].    Your next appointment:   6 month(s)  Provider:   Lonni LITTIE Nanas, MD     Other Instructions Check blood pressure twice daily for next week and send via mychart            Signed, Lonni LITTIE Nanas, MD  11/28/2023 4:51 PM    [facility]

## 2023-11-28 ENCOUNTER — Encounter: Payer: Self-pay | Admitting: Cardiology

## 2023-11-28 ENCOUNTER — Ambulatory Visit: Payer: Medicare Other | Attending: Cardiology | Admitting: Cardiology

## 2023-11-28 VITALS — BP 139/90 | HR 91 | Ht 62.0 in | Wt 156.0 lb

## 2023-11-28 DIAGNOSIS — E785 Hyperlipidemia, unspecified: Secondary | ICD-10-CM | POA: Diagnosis not present

## 2023-11-28 DIAGNOSIS — I4821 Permanent atrial fibrillation: Secondary | ICD-10-CM | POA: Diagnosis not present

## 2023-11-28 DIAGNOSIS — I10 Essential (primary) hypertension: Secondary | ICD-10-CM | POA: Diagnosis not present

## 2023-11-28 DIAGNOSIS — Z720 Tobacco use: Secondary | ICD-10-CM

## 2023-11-28 DIAGNOSIS — I351 Nonrheumatic aortic (valve) insufficiency: Secondary | ICD-10-CM

## 2023-11-28 NOTE — Patient Instructions (Addendum)
 Medication Instructions:  Continue current medication *If you need a refill on your cardiac medications before your next appointment, please call your pharmacy*   Lab Work: Cmet, mg, cbc, lipid panel If you have labs (blood work) drawn today and your tests are completely normal, you will receive your results only by: MyChart Message (if you have MyChart) OR A paper copy in the mail If you have any lab test that is abnormal or we need to change your treatment, we will call you to review the results.   Testing/Procedures: Echo  Your physician has requested that you have an echocardiogram. Echocardiography is a painless test that uses sound waves to create images of your heart. It provides your doctor with information about the size and shape of your heart and how well your heart's chambers and valves are working. This procedure takes approximately one hour. There are no restrictions for this procedure. Please do NOT wear cologne, perfume, aftershave, or lotions (deodorant is allowed). Please arrive 15 minutes prior to your appointment time.  Please note: We ask at that you not bring children with you during ultrasound (echo/ vascular) testing. Due to room size and safety concerns, children are not allowed in the ultrasound rooms during exams. Our front office staff cannot provide observation of children in our lobby area while testing is being conducted. An adult accompanying a patient to their appointment will only be allowed in the ultrasound room at the discretion of the ultrasound technician under special circumstances. We apologize for any inconvenience.    Follow-Up: At [facility], you and your health needs are our priority.  As part of our continuing mission to provide you with exceptional heart care, we have created designated Provider Care Teams.  These Care Teams include your primary Cardiologist (physician) and Advanced Practice Providers (APPs -  Physician Assistants and  Nurse Practitioners) who all work together to provide you with the care you need, when you need it.  We recommend signing up for the patient portal called MyChart.  Sign up information is provided on this After Visit Summary.  MyChart is used to connect with patients for Virtual Visits (Telemedicine).  Patients are able to view lab/test results, encounter notes, upcoming appointments, etc.  Non-urgent messages can be sent to your provider as well.   To learn more about what you can do with MyChart, go to [email or web address].    Your next appointment:   6 month(s)  Provider:   Lonni LITTIE Nanas, MD     Other Instructions Check blood pressure twice daily for next week and send via mychart

## 2023-12-06 LAB — CBC WITH DIFFERENTIAL/PLATELET
Basophils Absolute: 0.1 10*3/uL (ref 0.0–0.2)
Basos: 1 %
EOS (ABSOLUTE): 0.2 10*3/uL (ref 0.0–0.4)
Eos: 2 %
Hematocrit: 46.1 % (ref 34.0–46.6)
Hemoglobin: 16.2 g/dL — ABNORMAL HIGH (ref 11.1–15.9)
Immature Grans (Abs): 0.1 10*3/uL (ref 0.0–0.1)
Immature Granulocytes: 1 %
Lymphocytes Absolute: 2.4 10*3/uL (ref 0.7–3.1)
Lymphs: 27 %
MCH: 32.9 pg (ref 26.6–33.0)
MCHC: 35.1 g/dL (ref 31.5–35.7)
MCV: 94 fL (ref 79–97)
Monocytes Absolute: 1 10*3/uL — ABNORMAL HIGH (ref 0.1–0.9)
Monocytes: 11 %
Neutrophils Absolute: 5.2 10*3/uL (ref 1.4–7.0)
Neutrophils: 58 %
Platelets: 110 10*3/uL — ABNORMAL LOW (ref 150–450)
RBC: 4.92 x10E6/uL (ref 3.77–5.28)
RDW: 12.4 % (ref 11.7–15.4)
WBC: 8.9 10*3/uL (ref 3.4–10.8)

## 2023-12-06 LAB — COMPREHENSIVE METABOLIC PANEL
ALT: 35 [IU]/L — ABNORMAL HIGH (ref 0–32)
AST: 36 [IU]/L (ref 0–40)
Albumin: 4.3 g/dL (ref 3.9–4.9)
Alkaline Phosphatase: 119 [IU]/L (ref 44–121)
BUN/Creatinine Ratio: 14 (ref 12–28)
BUN: 10 mg/dL (ref 8–27)
Bilirubin Total: 1 mg/dL (ref 0.0–1.2)
CO2: 23 mmol/L (ref 20–29)
Calcium: 10.2 mg/dL (ref 8.7–10.3)
Chloride: 87 mmol/L — ABNORMAL LOW (ref 96–106)
Creatinine, Ser: 0.71 mg/dL (ref 0.57–1.00)
Globulin, Total: 2.3 g/dL (ref 1.5–4.5)
Glucose: 92 mg/dL (ref 70–99)
Potassium: 4.1 mmol/L (ref 3.5–5.2)
Sodium: 126 mmol/L — ABNORMAL LOW (ref 134–144)
Total Protein: 6.6 g/dL (ref 6.0–8.5)
eGFR: 91 mL/min/{1.73_m2} (ref >59)

## 2023-12-06 LAB — LIPID PANEL
Chol/HDL Ratio: 2.1 {ratio} (ref 0.0–4.4)
Cholesterol, Total: 150 mg/dL (ref 100–199)
HDL: 72 mg/dL (ref >39)
LDL Chol Calc (NIH): 59 mg/dL (ref 0–99)
Triglycerides: 109 mg/dL (ref 0–149)
VLDL Cholesterol Cal: 19 mg/dL (ref 5–40)

## 2023-12-06 LAB — MAGNESIUM: Magnesium: 1.8 mg/dL (ref 1.6–2.3)

## 2023-12-09 ENCOUNTER — Other Ambulatory Visit: Payer: Self-pay | Admitting: Cardiology

## 2023-12-09 ENCOUNTER — Telehealth: Payer: Self-pay | Admitting: *Deleted

## 2023-12-09 DIAGNOSIS — I4819 Other persistent atrial fibrillation: Secondary | ICD-10-CM

## 2023-12-09 DIAGNOSIS — I10 Essential (primary) hypertension: Secondary | ICD-10-CM

## 2023-12-09 MED ORDER — APIXABAN 5 MG PO TABS
5.0000 mg | ORAL_TABLET | Freq: Two times a day (BID) | ORAL | 5 refills | Status: DC
Start: 2023-12-09 — End: 2023-12-24

## 2023-12-09 NOTE — Telephone Encounter (Signed)
Eliquis 5mg  refill request received. Patient is 74 years old, weight-70.8kg, Crea-0.71 on 12/05/23, Diagnosis-Afib, and last seen by Dr. Bjorn Pippin on 11/28/23. Dose is appropriate based on dosing criteria. Will send in refill to requested pharmacy.

## 2023-12-09 NOTE — Telephone Encounter (Signed)
Spoke with pt, she reports the last time she stopped the hydrochlorothiazide, she reports that she had trouble breathing and swelling in her ankles. Patient given the number for senior services to see if she can get help with the eliquis from medicare, if not will help her apply for patient assistance. Will forward to dr Bjorn Pippin regarding the hydrochlorothiazide.

## 2023-12-09 NOTE — Telephone Encounter (Signed)
-----   Message from Little Ishikawa sent at 12/08/2023  8:46 PM EST ----- Low sodium, suspect due to hydrochlorothiazide.  Recommend stopping hydrochlorothiazide.  Would start amlodipine 5 mg daily.  Check BP daily for next 2 weeks and let us know results  Repeat BMET in 1-2 weeks

## 2023-12-10 ENCOUNTER — Encounter (HOSPITAL_BASED_OUTPATIENT_CLINIC_OR_DEPARTMENT_OTHER): Payer: Self-pay

## 2023-12-10 MED ORDER — AMLODIPINE BESYLATE 5 MG PO TABS: 5.0000 mg | ORAL_TABLET | Freq: Every day | ORAL | 3 refills | Status: DC

## 2023-12-10 NOTE — Telephone Encounter (Signed)
Called patient and made patient aware to start Amlodipine 5mg  daily and check bp daily for next 2 weeks and send readings to mychart and bring to visit. Made patient appt  2/4 at [facility] office. Made patient aware that labs will be drawn at that visit. Patient verbalized an understanding.

## 2023-12-10 NOTE — Telephone Encounter (Signed)
Would recommend stopping the hydrochlorothiazide for now and trying amlodipine.  Can we add her onto my schedule on 2/4?  Looks like there are a lot of open spots that day, whatever works best for her.  Would let her know I am at [facility] that day.  We can see how she is doing with the medication change and plan to recheck labs at that time

## 2023-12-11 ENCOUNTER — Other Ambulatory Visit: Payer: Self-pay | Admitting: *Deleted

## 2023-12-11 DIAGNOSIS — I10 Essential (primary) hypertension: Secondary | ICD-10-CM

## 2023-12-24 ENCOUNTER — Ambulatory Visit (HOSPITAL_BASED_OUTPATIENT_CLINIC_OR_DEPARTMENT_OTHER): Payer: Medicare Other | Admitting: Cardiology

## 2023-12-24 ENCOUNTER — Ambulatory Visit (HOSPITAL_COMMUNITY): Payer: Medicare Other | Attending: Cardiovascular Disease

## 2023-12-24 ENCOUNTER — Encounter (HOSPITAL_BASED_OUTPATIENT_CLINIC_OR_DEPARTMENT_OTHER): Payer: Self-pay | Admitting: Cardiology

## 2023-12-24 VITALS — BP 128/82 | HR 71 | Ht 62.0 in | Wt 155.8 lb

## 2023-12-24 DIAGNOSIS — I5032 Chronic diastolic (congestive) heart failure: Secondary | ICD-10-CM

## 2023-12-24 DIAGNOSIS — I10 Essential (primary) hypertension: Secondary | ICD-10-CM | POA: Diagnosis not present

## 2023-12-24 DIAGNOSIS — E785 Hyperlipidemia, unspecified: Secondary | ICD-10-CM

## 2023-12-24 DIAGNOSIS — I351 Nonrheumatic aortic (valve) insufficiency: Secondary | ICD-10-CM

## 2023-12-24 DIAGNOSIS — Z720 Tobacco use: Secondary | ICD-10-CM

## 2023-12-24 DIAGNOSIS — I4821 Permanent atrial fibrillation: Secondary | ICD-10-CM | POA: Diagnosis not present

## 2023-12-24 LAB — BASIC METABOLIC PANEL
BUN/Creatinine Ratio: 12 (ref 12–28)
BUN: 9 mg/dL (ref 8–27)
CO2: 22 mmol/L (ref 20–29)
Calcium: 10 mg/dL (ref 8.7–10.3)
Chloride: 96 mmol/L (ref 96–106)
Creatinine, Ser: 0.78 mg/dL (ref 0.57–1.00)
Glucose: 108 mg/dL — ABNORMAL HIGH (ref 70–99)
Potassium: 4.7 mmol/L (ref 3.5–5.2)
Sodium: 137 mmol/L (ref 134–144)
eGFR: 82 mL/min/{1.73_m2} (ref >59)

## 2023-12-24 LAB — ECHOCARDIOGRAM COMPLETE: S' Lateral: 2.8 cm

## 2023-12-24 MED ORDER — FUROSEMIDE 20 MG PO TABS: ORAL_TABLET | ORAL | 3 refills | Status: DC

## 2023-12-24 MED ORDER — DABIGATRAN ETEXILATE MESYLATE 150 MG PO CAPS: 150.0000 mg | ORAL_CAPSULE | Freq: Two times a day (BID) | ORAL | 1 refills | Status: DC

## 2023-12-24 NOTE — Patient Instructions (Signed)
 Medication Instructions:  STOP ELIQUIS   START PRADAXA  150 MG TWICE  DAY   STOP hydrochlorothiazide  START FUROSEMIDE  20 MG DAILY AS NEEDED FOR WEIGHT GAIN OF 2 POUNDS IN 24 HOURS OR 5 POUNDS IN A WEEK   *If you need a refill on your cardiac medications before your next appointment, please call your pharmacy*  Lab Work: NONE  Testing/Procedures: NONE  Follow-Up: At [facility], you and your health needs are our priority.  As part of our continuing mission to provide you with exceptional heart care, we have created designated Provider Care Teams.  These Care Teams include your primary Cardiologist (physician) and Advanced Practice Providers (APPs -  Physician Assistants and Nurse Practitioners) who all work together to provide you with the care you need, when you need it.  We recommend signing up for the patient portal called MyChart.  Sign up information is provided on this After Visit Summary.  MyChart is used to connect with patients for Virtual Visits (Telemedicine).  Patients are able to view lab/test results, encounter notes, upcoming appointments, etc.  Non-urgent messages can be sent to your provider as well.   To learn more about what you can do with MyChart, go to [email or web address].    Your next appointment:   6 month(s)  Provider:   Lonni Nanas, MD    Other Instructions

## 2023-12-24 NOTE — Progress Notes (Signed)
 Cardiology Office Note:    Date:  12/24/2023   ID:  Annette Kelley, DOB 19-Dec-1950, MRN [id number]  PCP:  Jolee Madelin Patch, MD  Cardiologist:  Annette LITTIE Nanas, MD  Electrophysiologist:  None   Referring MD: Jolee Madelin Patch, MD   Chief Complaint  Patient presents with   Atrial Fibrillation     History of Present Illness:    Annette Kelley is a 73 y.o. female with a hx of atrial fibrillation, hypertension, hyperlipidemia who presents for follow-up.  She was referred by Dr. Jolee for evaluation of atrial fibrillation, initially seen on 03/31/2021.  She was seen by Dr. Jolee on 02/22/2021.  Found to be in new atrial fibrillation, rate 100s.  Echocardiogram in 04/25/2021 showed normal biventricular function, mild to moderate aortic regurgitation.  Underwent successful cardioversion on 06/01/2021.  Since last clinic visit, she reports she is doing okay.  Has had intermittent shortness of breath, denies any chest pain.  She denies any lightheadedness, syncope, or palpitations.  No bleeding on Eliquis .  She continues to smoke, less than 1 pack/day.  Past Medical History:  Diagnosis Date   High cholesterol    Hypertension     Past Surgical History:  Procedure Laterality Date   APPENDECTOMY     CARDIOVERSION N/A 06/01/2021   Procedure: CARDIOVERSION;  Surgeon: Delford Maude BROCKS, MD;  Location: [facility];  Service: Cardiovascular;  Laterality: N/A;   TUBAL LIGATION      Current Medications: Current Meds  Medication Sig   amLODipine  (NORVASC ) 5 MG tablet Take 1 tablet (5 mg total) by mouth daily.   atorvastatin (LIPITOR) 20 MG tablet Take 1 tablet by mouth daily.   dabigatran  (PRADAXA ) 150 MG CAPS capsule Take 1 capsule (150 mg total) by mouth 2 (two) times daily.   furosemide  (LASIX ) 20 MG tablet TAKE 1 TABLET DAILY AS NEEDED FOR WEIGHT GAIN OF 2 POUNDS IN 24 HOURS OR 5 POUNDS IN A WEEK   metoprolol  succinate (TOPROL -XL) 50 MG 24 hr tablet Take 50 mg by mouth daily. Take  with or immediately following a meal.   telmisartan (MICARDIS) 80 MG tablet Take 80 mg by mouth daily.   [DISCONTINUED] apixaban  (ELIQUIS ) 5 MG TABS tablet Take 1 tablet (5 mg total) by mouth 2 (two) times daily.   [DISCONTINUED] hydrochlorothiazide (HYDRODIURIL) 25 MG tablet Take 25 mg by mouth daily.     Allergies:   Other and Sulfa antibiotics   Social History   Socioeconomic History   Marital status: Single    Spouse name: Not on file   Number of children: Not on file   Years of education: Not on file   Highest education level: Not on file  Occupational History   Not on file  Tobacco Use   Smoking status: Every Day    Current packs/day: 1.00    Types: Cigarettes   Smokeless tobacco: Never  Vaping Use   Vaping status: Never Used  Substance and Sexual Activity   Alcohol use: Yes    Alcohol/week: 2.0 standard drinks of alcohol    Types: 2 Cans of beer per week   Drug use: Never   Sexual activity: Not Currently  Other Topics Concern   Not on file  Social History Narrative   Not on file   Social Drivers of Health   Financial Resource Strain: Low Risk  (02/05/2020)   Received from [facility] visits prior to 01/19/2023., [facility] visits prior to  01/19/2023.   Overall Financial Resource Strain (CARDIA)    Difficulty of Paying Living Expenses: Not very hard  Food Insecurity: Low Risk  (07/17/2023)   [facility]   Hunger Vital Sign    Worried About Running Out of Food in the Last Year: Never true    Ran Out of Food in the Last Year: Never true  Transportation Needs: No Transportation Needs (07/17/2023)   Received from [facility]   Transportation    In the past 12 months, has lack of reliable transportation kept you from medical appointments, meetings, work or from getting things needed for daily living? : No  Physical Activity: Not on file  Stress: No Stress Concern Present (02/05/2020)   [facility] prior to 01/19/2023., [facility] visits prior to 01/19/2023.   [employer] of Occupational Health - Occupational Stress Questionnaire    Feeling of Stress : Not at all  Social Connections: Socially Isolated (02/05/2020)   [facility] prior to 01/19/2023., [facility] visits prior to 01/19/2023.   Social Connection and Isolation Panel [NHANES]    Frequency of Communication with Friends and Family: More than three times a week    Frequency of Social Gatherings with Friends and Family: Three times a week    Attends Religious Services: Never    Active Member of Clubs or Organizations: No    Attends Banker Meetings: Never    Marital Status: Widowed     Family History: The patient's family history includes Cancer in her mother; Diabetes in her mother; Hypertension in her mother.  ROS:   Please see the history of present illness.     (+) All other systems reviewed and are negative.  EKGs/Labs/Other Studies Reviewed:    The following studies were reviewed today: ECHO 04/25/21:   IMPRESSIONS    1. The aortic valve is tricuspid. There is mild calcification of the  aortic valve. There is moderate thickening of the aortic valve. Aortic  valve regurgitation is mild to moderate. Mild aortic valve sclerosis is  present, with no evidence of aortic valve   stenosis.   2. Left ventricular ejection fraction, by estimation, is 55 to 60%. The  left ventricle has normal function. The left ventricle has no regional  wall motion abnormalities. Left ventricular diastolic parameters are  indeterminate.   3. Right ventricular systolic function is normal. The right ventricular  size is normal. Tricuspid regurgitation signal is inadequate for assessing  PA pressure.   4. The mitral valve is normal in structure. Trivial mitral valve  regurgitation. No evidence of mitral  stenosis.   5. The inferior vena cava is normal in size with greater than 50%  respiratory variability, suggesting right atrial pressure of 3 mmHg.   EKG:   11/28/2023: Atrial fibrillation, rate 91, low voltage 06/07/21: sinus rhythm, rate 68 bpm, poor r-wave progression 04/25/2021: Atrial Fibrillation, rate 90, poor R wave progression 03/31/2021: Atrial fibrillation, rate 99 bpm, No ST abnormalities  Recent Labs: 12/05/2023: ALT 35; Hemoglobin 16.2; Magnesium 1.8; Platelets 110 12/23/2023: BUN 9; Creatinine, Ser 0.78; Potassium 4.7; Sodium 137  Recent Lipid Panel    Component Value Date/Time   CHOL 150 12/05/2023 1503   TRIG 109 12/05/2023 1503   HDL 72 12/05/2023 1503   CHOLHDL 2.1 12/05/2023 1503   LDLCALC 59 12/05/2023 1503    Physical Exam:    VS:  BP 128/82   Pulse 71   Ht 5' 2 (1.575 m)   Wt 155 lb 12.8 oz (70.7 kg)   SpO2 94%   BMI 28.50 kg/m     Wt Readings from Last 3 Encounters:  12/24/23 155 lb 12.8 oz (70.7 kg)  11/28/23 156 lb (70.8 kg)  06/30/23 160 lb (72.6 kg)     GEN: Well nourished, well developed in no acute distress HEENT: Normal NECK: No JVD; No carotid bruits LYMPHATICS: No lymphadenopathy CARDIAC: RRR, no murmurs, rubs, gallops RESPIRATORY:  Clear to auscultation without rales, wheezing or rhonchi  ABDOMEN: Soft, non-tender, non-distended MUSCULOSKELETAL:  No edema; No deformity  SKIN: Warm and dry NEUROLOGIC:  Alert and oriented x 3 PSYCHIATRIC:  Normal affect   ASSESSMENT:    1. Permanent atrial fibrillation (HCC)   2. Aortic valve insufficiency, etiology of cardiac valve disease unspecified   3. Essential hypertension   4. Chronic diastolic heart failure (HCC)   5. Tobacco use   6. Hyperlipidemia, unspecified hyperlipidemia type       PLAN:    Permanent atrial fibrillation: CHA2DS2-VASc score 3 (hypertension, age, female).  Echocardiogram in 04/25/2021 showed normal biventricular function, mild to moderate aortic regurgitation.   Underwent successful cardioversion on 06/01/2021.  Was in A-fib on follow-up 03/2022, appears has been in A-fib since that time, suspect permanent A-fib at this point -Continue toprol  XL 50 mg daily -Reports Eliquis  has been too expensive.  Will switch to Pradaxa  since it is now generic. -Sleep study recommended but she declines at this time  Aortic regurgitation: Mild to moderate AI on echo 04/25/2021.  Will monitor, recommend repeat echocardiogram   Hypertension: On hydrochlorothiazide 25 mg daily and telmisartan 80 mg daily.  HCTZ discontinued due to hyponatremia.  Started on amlodipine  5 mg daily.  Hyponatremia resolved with stopping hydrochlorothiazide  Chronic diastolic heart failure: Reports has had some lower extremity edema since stopping HCTZ.  Recommend starting Lasix  20 mg daily as needed if gains more than 3 pounds in 1 day or 5 pounds in 1 week   Hyperlipidemia: On atorvastatin 20 mg daily.  LDL 59 on 12/05/2023  Tobacco use: Counseled on  risk of tobacco use and cessation strongly encouraged   RTC in 6 months   Medication Adjustments/Labs and Tests Ordered: Current medicines are reviewed at length with the patient today.  Concerns regarding medicines are outlined above.  No orders of the defined types were placed in this encounter.   Meds ordered this encounter  Medications   dabigatran  (PRADAXA ) 150 MG CAPS capsule    Sig: Take 1 capsule (150 mg total) by mouth 2 (two) times daily.    Dispense:  60 capsule    Refill:  1    D/C ELIQUIS    furosemide  (LASIX ) 20 MG tablet    Sig: TAKE 1 TABLET DAILY AS NEEDED FOR WEIGHT GAIN OF 2 POUNDS IN 24 HOURS OR 5 POUNDS IN A WEEK    Dispense:  90 tablet    Refill:  3    D/C hydrochlorothiazide     Patient Instructions  Medication Instructions:  STOP ELIQUIS   START PRADAXA  150 MG TWICE  DAY   STOP hydrochlorothiazide  START FUROSEMIDE  20 MG DAILY AS NEEDED FOR WEIGHT GAIN OF 2 POUNDS IN 24 HOURS OR 5 POUNDS IN A WEEK   *If  you need a refill on your cardiac medications before your next appointment, please call your pharmacy*  Lab Work: NONE  Testing/Procedures: NONE  Follow-Up: At  [facility], you and your health needs are our priority.  As part of our continuing mission to provide you with exceptional heart care, we have created designated Provider Care Teams.  These Care Teams include your primary Cardiologist (physician) and Advanced Practice Providers (APPs -  Physician Assistants and Nurse Practitioners) who all work together to provide you with the care you need, when you need it.  We recommend signing up for the patient portal called MyChart.  Sign up information is provided on this After Visit Summary.  MyChart is used to connect with patients for Virtual Visits (Telemedicine).  Patients are able to view lab/test results, encounter notes, upcoming appointments, etc.  Non-urgent messages can be sent to your provider as well.   To learn more about what you can do with MyChart, go to [email or web address].    Your next appointment:   6 month(s)  Provider:   Lonni Nanas, MD    Other Instructions            Signed, Annette LITTIE Nanas, MD  12/24/2023 5:24 PM    [facility]

## 2023-12-25 ENCOUNTER — Other Ambulatory Visit (HOSPITAL_COMMUNITY): Payer: Self-pay

## 2023-12-25 ENCOUNTER — Telehealth: Payer: Self-pay | Admitting: Pharmacy Technician

## 2023-12-25 NOTE — Telephone Encounter (Signed)
 Pharmacy Patient Advocate Encounter   Received notification from Fax that prior authorization for Dabigatran  Etexilate Mesylate 150MG  capsules is required/requested.   Insurance verification completed.   The patient is insured through [employer] .   Per test claim: PA required; PA submitted to above mentioned insurance via CoverMyMeds Key/confirmation #/EOC [id number] Status is pending

## 2023-12-26 NOTE — Telephone Encounter (Signed)
 Will forward to Dr Alda Amas for review

## 2023-12-26 NOTE — Telephone Encounter (Signed)
 Pharmacy Patient Advocate Encounter  Received notification from [facility] that Prior Authorization for Dabigatran  Etexilate Mesylate 150MG  capsules  has been DENIED.  See denial reason below. No denial letter attached in CMM. Will attach denial letter to Media tab once received.    I only saw she was on eliquis  then stopped due to cost.   PA #/Case ID/Reference #: [id number].

## 2023-12-27 NOTE — Telephone Encounter (Signed)
 Looks like Xarelto  is preferred by her insurance, would discontinue Pradaxa  and start Xarelto  20 mg daily

## 2023-12-30 MED ORDER — RIVAROXABAN 20 MG PO TABS: 20.0000 mg | ORAL_TABLET | Freq: Every day | ORAL | 3 refills | Status: DC

## 2023-12-30 NOTE — Telephone Encounter (Signed)
 Called and made patient aware that per Dr. Alda Amas to start Xarelto  20 mg daily because it is preferred by Insurance. Patient verbalized an understanding.Aaron Aas

## 2023-12-30 NOTE — Addendum Note (Signed)
 Addended by: Darvin England on: 12/30/2023 08:10 AM   Modules accepted: Orders

## 2024-01-07 ENCOUNTER — Emergency Department (HOSPITAL_COMMUNITY): Payer: Medicare Other

## 2024-01-07 ENCOUNTER — Encounter (HOSPITAL_COMMUNITY): Payer: Self-pay

## 2024-01-07 ENCOUNTER — Other Ambulatory Visit: Payer: Self-pay

## 2024-01-07 ENCOUNTER — Emergency Department (HOSPITAL_COMMUNITY)
Admission: EM | Admit: 2024-01-07 | Discharge: 2024-01-08 | Disposition: A | Discharge status: Home / Self Care | Payer: Medicare Other | Attending: Emergency Medicine | Admitting: Emergency Medicine

## 2024-01-07 DIAGNOSIS — M7989 Other specified soft tissue disorders: Secondary | ICD-10-CM | POA: Diagnosis present

## 2024-01-07 DIAGNOSIS — D6832 Hemorrhagic disorder due to extrinsic circulating anticoagulants: Secondary | ICD-10-CM | POA: Insufficient documentation

## 2024-01-07 DIAGNOSIS — I10 Essential (primary) hypertension: Secondary | ICD-10-CM | POA: Insufficient documentation

## 2024-01-07 DIAGNOSIS — E039 Hypothyroidism, unspecified: Secondary | ICD-10-CM | POA: Insufficient documentation

## 2024-01-07 DIAGNOSIS — T45515A Adverse effect of anticoagulants, initial encounter: Secondary | ICD-10-CM | POA: Diagnosis not present

## 2024-01-07 DIAGNOSIS — D689 Coagulation defect, unspecified: Secondary | ICD-10-CM

## 2024-01-07 DIAGNOSIS — Z7901 Long term (current) use of anticoagulants: Secondary | ICD-10-CM | POA: Diagnosis not present

## 2024-01-07 DIAGNOSIS — Z79899 Other long term (current) drug therapy: Secondary | ICD-10-CM | POA: Diagnosis not present

## 2024-01-07 DIAGNOSIS — D72829 Elevated white blood cell count, unspecified: Secondary | ICD-10-CM | POA: Diagnosis not present

## 2024-01-07 NOTE — ED Provider Notes (Incomplete)
 [place] EMERGENCY DEPARTMENT AT [facility] Provider Note   CSN: [id number] Arrival date & time: 01/07/24  1549     History {Add pertinent medical, surgical, social history, OB history to HPI:1} Chief Complaint  Patient presents with  . Groin Pain    Annette Kelley is a 73 y.o. female with past medical history of HTN, appendectomy, hypothyroidism, HLD, SVT, A-fib, chronic Eliquis use presents emergency department for evaluation of right leg swelling and bruising to posterior thigh that she noticed last week.  She endorses that the swelling and bruising worsened last night and she is having difficulty with bearing weight secondary to pain.  She has been shuffling and using a walker, wheelchair at home   Groin Pain Pertinent negatives include no chest pain, no abdominal pain, no headaches and no shortness of breath.       Home Medications Prior to Admission medications   Medication Sig Start Date End Date Taking? Authorizing Provider  amLODipine (NORVASC) 5 MG tablet Take 1 tablet (5 mg total) by mouth daily. 12/10/23 03/09/24  Little Ishikawa, MD  atorvastatin (LIPITOR) 20 MG tablet Take 1 tablet by mouth daily. 12/29/21   [provider]  diclofenac Sodium (VOLTAREN) 1 % GEL Apply 1 application topically daily as needed (pain).    [provider]  furosemide (LASIX) 20 MG tablet TAKE 1 TABLET DAILY AS NEEDED FOR WEIGHT GAIN OF 2 POUNDS IN 24 HOURS OR 5 POUNDS IN A WEEK 12/24/23   Little Ishikawa, MD  meloxicam (MOBIC) 7.5 MG tablet Take 7.5 mg by mouth daily as needed for pain. Patient not taking: Reported on 06/30/2023 02/22/21   [provider]  metoprolol succinate (TOPROL-XL) 50 MG 24 hr tablet Take 50 mg by mouth daily. Take with or immediately following a meal.    [provider]  rivaroxaban (XARELTO) 20 MG TABS tablet Take 1 tablet (20 mg total) by mouth daily with supper. 12/30/23   Little Ishikawa, MD   telmisartan (MICARDIS) 80 MG tablet Take 80 mg by mouth daily.    [provider]      Allergies    Other and Sulfa antibiotics    Review of Systems   Review of Systems  Constitutional:  Negative for chills, fatigue and fever.  Respiratory:  Negative for cough, chest tightness, shortness of breath and wheezing.   Cardiovascular:  Negative for chest pain and palpitations.  Gastrointestinal:  Negative for abdominal pain, constipation, diarrhea, nausea and vomiting.  Neurological:  Negative for dizziness, seizures, weakness, light-headedness, numbness and headaches.    Physical Exam Updated Vital Signs BP 120/77   Pulse 99   Temp 98 F (36.7 C) (Oral)   Resp 16   Ht 5\' 2"  (1.575 m)   Wt 68.9 kg   SpO2 95%   BMI 27.80 kg/m  Physical Exam Vitals and nursing note reviewed.  Constitutional:      General: She is not in acute distress.    Appearance: Normal appearance.  HENT:     Head: Normocephalic and atraumatic.  Eyes:     Conjunctiva/sclera: Conjunctivae normal.  Cardiovascular:     Rate and Rhythm: Normal rate.  Pulmonary:     Effort: Pulmonary effort is normal. No respiratory distress.  Skin:    Coloration: Skin is not jaundiced or pale.     Comments: Mildly swollen right thigh.  Ecchymosis to posterior thigh.  Mildly tender to touch of right thigh.  Is not warm nor  infectious appearing  Neurological:     Mental Status: She is alert and oriented to person, place, and time. Mental status is at baseline.     ED Results / Procedures / Treatments   Labs (all labs ordered are listed, but only abnormal results are displayed) Labs Reviewed  CBC WITH DIFFERENTIAL/PLATELET  BASIC METABOLIC PANEL    EKG None  Radiology DG Pelvis 1-2 Views Result Date: 01/07/2024 CLINICAL DATA:  Right hip pain. EXAM: PELVIS - 1-2 VIEW; RIGHT FEMUR 2 VIEWS COMPARISON:  None Available. FINDINGS: There is no evidence of acute fracture or dislocation. No pelvic bone lesions are  seen. Mild degenerative changes of the right hip. Right greater trochanteric enthesopathy. Degenerative changes of the visualized lower lumbar spine. Atherosclerotic vascular calcifications are noted. IMPRESSION: No acute osseous abnormality. Electronically Signed   By: Hart Robinsons M.D.   On: 01/07/2024 20:00   DG Femur Min 2 Views Right Result Date: 01/07/2024 CLINICAL DATA:  Right hip pain. EXAM: PELVIS - 1-2 VIEW; RIGHT FEMUR 2 VIEWS COMPARISON:  None Available. FINDINGS: There is no evidence of acute fracture or dislocation. No pelvic bone lesions are seen. Mild degenerative changes of the right hip. Right greater trochanteric enthesopathy. Degenerative changes of the visualized lower lumbar spine. Atherosclerotic vascular calcifications are noted. IMPRESSION: No acute osseous abnormality. Electronically Signed   By: Hart Robinsons M.D.   On: 01/07/2024 20:00    Procedures Procedures  {Document cardiac monitor, telemetry assessment procedure when appropriate:1}  Medications Ordered in ED Medications - No data to display  ED Course/ Medical Decision Making/ A&P Clinical Course as of 01/07/24 2343  Tue Jan 07, 2024  2044 DG Femur Min 2 Views Right [OZ]    Clinical Course User Index [OZ] Smitty Knudsen, PA-C   {   Click here for ABCD2, HEART and other calculatorsREFRESH Note before signing :1}                              Medical Decision Making Amount and/or Complexity of Data Reviewed Labs: ordered.   ***  {Document critical care time when appropriate:1} {Document review of labs and clinical decision tools ie heart score, Chads2Vasc2 etc:1}  {Document your independent review of radiology images, and any outside records:1} {Document your discussion with family members, caretakers, and with consultants:1} {Document social determinants of health affecting pt's care:1} {Document your decision making why or why not admission, treatments were needed:1} Final Clinical  Impression(s) / ED Diagnoses Final diagnoses:  Anticoagulant-induced bleeding (HCC)  Right leg swelling    Rx / DC Orders ED Discharge Orders          Ordered    US Venous Img Lower Unilateral Right (DVT)        01/07/24 2342

## 2024-01-07 NOTE — ED Triage Notes (Signed)
 Right groin pain that started last week and got worse last night, pt reports bruising to back of thigh. Pt states she is unable to bear weight and has been shuffling and using a walker/wheelchair at home

## 2024-01-07 NOTE — ED Provider Notes (Signed)
 [place] EMERGENCY DEPARTMENT AT [facility] Provider Note   CSN: [id number] Arrival date & time: 01/07/24  1549     History  Chief Complaint  Patient presents with   Groin Pain    Annette Kelley is a 73 y.o. female with past medical history of HTN, appendectomy, hypothyroidism, HLD, SVT, A-fib, chronic Eliquis use presents emergency department for evaluation of right leg swelling and bruising to posterior thigh that she noticed last week.  She endorses that the swelling and bruising worsened last night and she is having difficulty with bearing weight secondary to pain.  She has been shuffling and using a walker, wheelchair at home.  She denies recent injury, fevers  Of note, she has been evaluated for this before on 06/30/2023 with similar complaints of right leg swelling and ecchymosis while on Eliquis.  At that time, she had a negative DVT study and it was determined that she had anticoagulant induced bleeding   Groin Pain Pertinent negatives include no chest pain, no abdominal pain, no headaches and no shortness of breath.       Home Medications Prior to Admission medications   Medication Sig Start Date End Date Taking? Authorizing Provider  amLODipine (NORVASC) 5 MG tablet Take 1 tablet (5 mg total) by mouth daily. 12/10/23 03/09/24  Little Ishikawa, MD  atorvastatin (LIPITOR) 20 MG tablet Take 1 tablet by mouth daily. 12/29/21   [provider]  diclofenac Sodium (VOLTAREN) 1 % GEL Apply 1 application topically daily as needed (pain).    [provider]  furosemide (LASIX) 20 MG tablet TAKE 1 TABLET DAILY AS NEEDED FOR WEIGHT GAIN OF 2 POUNDS IN 24 HOURS OR 5 POUNDS IN A WEEK 12/24/23   Little Ishikawa, MD  meloxicam (MOBIC) 7.5 MG tablet Take 7.5 mg by mouth daily as needed for pain. Patient not taking: Reported on 06/30/2023 02/22/21   [provider]  metoprolol succinate (TOPROL-XL) 50 MG 24 hr tablet Take 50 mg by mouth  daily. Take with or immediately following a meal.    [provider]  rivaroxaban (XARELTO) 20 MG TABS tablet Take 1 tablet (20 mg total) by mouth daily with supper. 12/30/23   Little Ishikawa, MD  telmisartan (MICARDIS) 80 MG tablet Take 80 mg by mouth daily.    [provider]      Allergies    Other and Sulfa antibiotics    Review of Systems   Review of Systems  Constitutional:  Negative for chills, fatigue and fever.  Respiratory:  Negative for cough, chest tightness, shortness of breath and wheezing.   Cardiovascular:  Negative for chest pain and palpitations.  Gastrointestinal:  Negative for abdominal pain, constipation, diarrhea, nausea and vomiting.  Neurological:  Negative for dizziness, seizures, weakness, light-headedness, numbness and headaches.    Physical Exam Updated Vital Signs BP 120/77   Pulse 99   Temp 98 F (36.7 C) (Oral)   Resp 16   Ht 5\' 2"  (1.575 m)   Wt 68.9 kg   SpO2 95%   BMI 27.80 kg/m  Physical Exam Vitals and nursing note reviewed.  Constitutional:      General: She is not in acute distress.    Appearance: Normal appearance.  HENT:     Head: Normocephalic and atraumatic.  Eyes:     Conjunctiva/sclera: Conjunctivae normal.  Cardiovascular:     Rate and Rhythm: Normal rate.     Pulses:  Radial pulses are 2+ on the right side and 2+ on the left side.       Dorsalis pedis pulses are 2+ on the right side and 2+ on the left side.  Pulmonary:     Effort: Pulmonary effort is normal. No respiratory distress.  Skin:    Coloration: Skin is not jaundiced or pale.     Comments: Mildly swollen right thigh.  Ecchymosis to posterior thigh.  Mildly tender to touch of right thigh.  Is not warm nor infectious appearing  Neurological:     Mental Status: She is alert and oriented to person, place, and time. Mental status is at baseline.     ED Results / Procedures / Treatments   Labs (all labs ordered are listed, but  only abnormal results are displayed) Labs Reviewed  CBC WITH DIFFERENTIAL/PLATELET - Abnormal; Notable for the following components:      Result Value   WBC 10.9 (*)    RBC 3.75 (*)    All other components within normal limits  BASIC METABOLIC PANEL - Abnormal; Notable for the following components:   Sodium 130 (*)    Potassium 3.2 (*)    Glucose, Bld 180 (*)    All other components within normal limits    EKG None  Radiology DG Pelvis 1-2 Views Result Date: 01/07/2024 CLINICAL DATA:  Right hip pain. EXAM: PELVIS - 1-2 VIEW; RIGHT FEMUR 2 VIEWS COMPARISON:  None Available. FINDINGS: There is no evidence of acute fracture or dislocation. No pelvic bone lesions are seen. Mild degenerative changes of the right hip. Right greater trochanteric enthesopathy. Degenerative changes of the visualized lower lumbar spine. Atherosclerotic vascular calcifications are noted. IMPRESSION: No acute osseous abnormality. Electronically Signed   By: Hart Robinsons M.D.   On: 01/07/2024 20:00   DG Femur Min 2 Views Right Result Date: 01/07/2024 CLINICAL DATA:  Right hip pain. EXAM: PELVIS - 1-2 VIEW; RIGHT FEMUR 2 VIEWS COMPARISON:  None Available. FINDINGS: There is no evidence of acute fracture or dislocation. No pelvic bone lesions are seen. Mild degenerative changes of the right hip. Right greater trochanteric enthesopathy. Degenerative changes of the visualized lower lumbar spine. Atherosclerotic vascular calcifications are noted. IMPRESSION: No acute osseous abnormality. Electronically Signed   By: Hart Robinsons M.D.   On: 01/07/2024 20:00    Procedures Procedures    Medications Ordered in ED Medications - No data to display  ED Course/ Medical Decision Making/ A&P Clinical Course as of 01/08/24 0104  Tue Jan 07, 2024  2044 DG Femur Min 2 Views Right [OZ]    Clinical Course User Index [OZ] Smitty Knudsen, PA-C                                 Medical Decision Making Amount and/or  Complexity of Data Reviewed Labs: ordered.   Patient presents to the ED for concern of right leg swelling, this involves an extensive number of treatment options, and is a complaint that carries with it a high risk of complications and morbidity.  The differential diagnosis includes cellulitis, anticoagulant induced bleeding, compartment syndro,me   Co morbidities that complicate the patient evaluation  Long term eliquis use PMHx of anticoagulant induced bleeding   Additional history obtained:  Additional history obtained from Nursing and Outside Medical Records   External records from outside source obtained and reviewed including  Triage RN note ED note from 06/30/2023  Lab Tests:  I Ordered, and personally interpreted labs.  The pertinent results include:   Mildly elevated leukocytosis of 10.9 Hemoglobin 12.3   Imaging Studies ordered:  I ordered imaging studies including DVT study  Outpatient procedure and pending at DC     Problem List / ED Course:  Right leg swelling Anticoagulant induced bleeding Swelling is likely due to current use of Eliquis causing bleeding into legs Low suspicion for compartment syndrome as patient has DP 2+ equal bilaterally.  There is no pallor to indicate poor tissue perfusion Although developed that she had a DVT in the setting of Eliquis, I did order DVT study to ensure that there is no hematoma or something causing bleeding, clot X-ray pelvis and femur are negative for fractures as etiology for bleeding] Has hx of this bleeding in past in same leg -will have patient follow-up with PCP regarding this and obtain additional H&H to ensure no anemia Discussed ED workup, disposition, return to emerged part precautions with patient expressed understand with plan.  All questions answered to her satisfaction.  She is agreeable to dc   Reevaluation:  After the interventions noted above, I reevaluated the patient and found that they have  :stayed the same   Social Determinants of Health:  Has PCP follow-up   Dispostion:  After consideration of the diagnostic results and the patients response to treatment, I feel that the patent would benefit from outpatient management PCP follow-up.    Final Clinical Impression(s) / ED Diagnoses Final diagnoses:  Anticoagulant-induced bleeding (HCC)  Right leg swelling    Rx / DC Orders ED Discharge Orders          Ordered    US Venous Img Lower Unilateral Right (DVT)        01/07/24 2342              Judithann Sheen, PA 01/08/24 0105    Gwyneth Sprout, MD 01/10/24 [phone]

## 2024-01-07 NOTE — Discharge Instructions (Signed)
 Thank you for letting us evaluate you today.  Your lab work is unremarkable.  This is likely due to your Eliquis causing you to bleed.  I have sent off an outpatient ultrasound for you to come back tomorrow.  I think it is unlikely that you have a blood clot as you are currently on Eliquis/a blood thinner but I will get an ultrasound to make sure.  Following this, please make sure to follow-up with PCP regarding results and further management.  Your x-ray is negative for fracture of your pelvis and right upper leg bone  Return to emergency department if you experience significant worsening swelling, pale cool leg

## 2024-01-08 ENCOUNTER — Ambulatory Visit (HOSPITAL_COMMUNITY): Payer: Medicare Other | Attending: Emergency Medicine

## 2024-01-08 LAB — CBC WITH DIFFERENTIAL/PLATELET
Abs Immature Granulocytes: 0.05 10*3/uL (ref 0.00–0.07)
Basophils Absolute: 0 10*3/uL (ref 0.0–0.1)
Basophils Relative: 0 %
Eosinophils Absolute: 0.1 10*3/uL (ref 0.0–0.5)
Eosinophils Relative: 1 %
HCT: 36.7 % (ref 36.0–46.0)
Hemoglobin: 12.3 g/dL (ref 12.0–15.0)
Immature Granulocytes: 1 %
Lymphocytes Relative: 20 %
Lymphs Abs: 2.1 10*3/uL (ref 0.7–4.0)
MCH: 32.8 pg (ref 26.0–34.0)
MCHC: 33.5 g/dL (ref 30.0–36.0)
MCV: 97.9 fL (ref 80.0–100.0)
Monocytes Absolute: 0.8 10*3/uL (ref 0.1–1.0)
Monocytes Relative: 8 %
Neutro Abs: 7.7 10*3/uL (ref 1.7–7.7)
Neutrophils Relative %: 70 %
Platelets: 201 10*3/uL (ref 150–400)
RBC: 3.75 MIL/uL — ABNORMAL LOW (ref 3.87–5.11)
RDW: 12.9 % (ref 11.5–15.5)
WBC: 10.9 10*3/uL — ABNORMAL HIGH (ref 4.0–10.5)
nRBC: 0 % (ref 0.0–0.2)

## 2024-01-08 LAB — BASIC METABOLIC PANEL
Anion gap: 8 (ref 5–15)
BUN: 10 mg/dL (ref 8–23)
CO2: 24 mmol/L (ref 22–32)
Calcium: 9.4 mg/dL (ref 8.9–10.3)
Chloride: 98 mmol/L (ref 98–111)
Creatinine, Ser: 0.63 mg/dL (ref 0.44–1.00)
GFR, Estimated: >60 mL/min (ref >60)
Glucose, Bld: 180 mg/dL — ABNORMAL HIGH (ref 70–99)
Potassium: 3.2 mmol/L — ABNORMAL LOW (ref 3.5–5.1)
Sodium: 130 mmol/L — ABNORMAL LOW (ref 135–145)

## 2024-01-08 NOTE — ED Notes (Signed)
 Waiting for response from MD on order information.

## 2024-04-09 ENCOUNTER — Encounter: Payer: Self-pay | Admitting: Cardiology

## 2024-07-06 NOTE — Progress Notes (Unsigned)
 Cardiology Office Note:    Date:  07/07/2024   ID:  Annette Kelley, DOB Jan 01, 1951, MRN [id number]  PCP:  Jolee Madelin Patch, MD  Cardiologist:  Lonni LITTIE Nanas, MD  Electrophysiologist:  None   Referring MD: Jolee Madelin Patch, MD   Chief Complaint  Patient presents with   Permanent atrial fibrillation    Follow-up    6 months     History of Present Illness:    Annette Kelley is a 73 y.o. female with a hx of atrial fibrillation, hypertension, hyperlipidemia who presents for follow-up.  She was referred by Dr. Jolee for evaluation of atrial fibrillation, initially seen on 03/31/2021.  She was seen by Dr. Jolee on 02/22/2021.  Found to be in new atrial fibrillation, rate 100s.  Echocardiogram in 04/25/2021 showed normal biventricular function, mild to moderate aortic regurgitation.  Underwent successful cardioversion on 06/01/2021.  Echocardiogram 12/2023 showed EF 55 to 60%, normal RV function, mild aortic regurgitation.  Since last clinic visit, she reports she is doing okay.  Has had issues with hematoma in right leg but has improved.  Denies any chest pain, dyspnea, lightheadedness, syncope, or palpitations.  Having swelling in right leg occasionally.  She takes Lasix  about once per week.  She is smoking up to 1 pack/day.   Past Medical History:  Diagnosis Date   High cholesterol    Hypertension     Past Surgical History:  Procedure Laterality Date   APPENDECTOMY     CARDIOVERSION N/A 06/01/2021   Procedure: CARDIOVERSION;  Surgeon: Delford Maude BROCKS, MD;  Location: [facility];  Service: Cardiovascular;  Laterality: N/A;   TUBAL LIGATION      Current Medications: Current Meds  Medication Sig   amLODipine  (NORVASC ) 5 MG tablet Take 1 tablet (5 mg total) by mouth daily.   atorvastatin (LIPITOR) 20 MG tablet Take 1 tablet by mouth daily.   furosemide  (LASIX ) 20 MG tablet TAKE 1 TABLET DAILY AS NEEDED FOR WEIGHT GAIN OF 2 POUNDS IN 24 HOURS OR 5 POUNDS IN A WEEK    metoprolol  succinate (TOPROL -XL) 50 MG 24 hr tablet Take 50 mg by mouth daily. Take with or immediately following a meal.   rivaroxaban  (XARELTO ) 20 MG TABS tablet Take 1 tablet (20 mg total) by mouth daily with supper.   telmisartan (MICARDIS) 80 MG tablet Take 80 mg by mouth daily.     Allergies:   Other and Sulfa antibiotics   Social History   Socioeconomic History   Marital status: Single    Spouse name: Not on file   Number of children: Not on file   Years of education: Not on file   Highest education level: Not on file  Occupational History   Not on file  Tobacco Use   Smoking status: Every Day    Current packs/day: 1.00    Types: Cigarettes   Smokeless tobacco: Never  Vaping Use   Vaping status: Never Used  Substance and Sexual Activity   Alcohol use: Yes    Alcohol/week: 2.0 standard drinks of alcohol    Types: 2 Cans of beer per week   Drug use: Never   Sexual activity: Not Currently  Other Topics Concern   Not on file  Social History Narrative   Not on file   Social Drivers of Health   Financial Resource Strain: Low Risk  (02/05/2020)   Received from [facility] visits prior to 01/19/2023.   Overall Physicist, medical Strain (CARDIA)  Difficulty of Paying Living Expenses: Not very hard  Food Insecurity: Low Risk  (01/22/2024)   Received from [facility]   Hunger Vital Sign    Within the past 12 months, you worried that your food would run out before you got money to buy more: Never true    Within the past 12 months, the food you bought just didn't last and you didn't have money to get more. : Never true  Transportation Needs: No Transportation Needs (01/22/2024)   Received from [employer]    In the past 12 months, has lack of reliable transportation kept you from medical appointments, meetings, work or from getting things needed for daily living? : No  Physical Activity: Not on file  Stress: No Stress Concern Present  (02/05/2020)   Received from [facility] visits prior to 01/19/2023.   [employer] of Occupational Health - Occupational Stress Questionnaire    Feeling of Stress : Not at all  Social Connections: Socially Isolated (02/05/2020)   Received from [facility] visits prior to 01/19/2023.   Social Connection and Isolation Panel    In a typical week, how many times do you talk on the phone with family, friends, or neighbors?: More than three times a week    How often do you get together with friends or relatives?: Three times a week    How often do you attend church or religious services?: Never    Do you belong to any clubs or organizations such as church groups, unions, fraternal or athletic groups, or school groups?: No    How often do you attend meetings of the clubs or organizations you belong to?: Never    Are you married, widowed, divorced, separated, never married, or living with a partner?: Widowed     Family History: The patient's family history includes Cancer in her mother; Diabetes in her mother; Hypertension in her mother.  ROS:   Please see the history of present illness.     (+) All other systems reviewed and are negative.  EKGs/Labs/Other Studies Reviewed:    The following studies were reviewed today: ECHO 04/25/21:   IMPRESSIONS    1. The aortic valve is tricuspid. There is mild calcification of the  aortic valve. There is moderate thickening of the aortic valve. Aortic  valve regurgitation is mild to moderate. Mild aortic valve sclerosis is  present, with no evidence of aortic valve   stenosis.   2. Left ventricular ejection fraction, by estimation, is 55 to 60%. The  left ventricle has normal function. The left ventricle has no regional  wall motion abnormalities. Left ventricular diastolic parameters are  indeterminate.   3. Right ventricular systolic function is normal. The right ventricular  size is normal. Tricuspid  regurgitation signal is inadequate for assessing  PA pressure.   4. The mitral valve is normal in structure. Trivial mitral valve  regurgitation. No evidence of mitral stenosis.   5. The inferior vena cava is normal in size with greater than 50%  respiratory variability, suggesting right atrial pressure of 3 mmHg.   EKG:   11/28/2023: Atrial fibrillation, rate 91, low voltage 06/07/21: sinus rhythm, rate 68 bpm, poor r-wave progression 04/25/2021: Atrial Fibrillation, rate 90, poor R wave progression 03/31/2021: Atrial fibrillation, rate 99 bpm, No ST abnormalities  Recent Labs: 12/05/2023: ALT 35; Magnesium 1.8 01/07/2024: BUN 10; Creatinine, Ser 0.63; Hemoglobin 12.3; Platelets 201; Potassium 3.2; Sodium 130  Recent Lipid  Panel    Component Value Date/Time   CHOL 150 12/05/2023 1503   TRIG 109 12/05/2023 1503   HDL 72 12/05/2023 1503   CHOLHDL 2.1 12/05/2023 1503   LDLCALC 59 12/05/2023 1503    Physical Exam:    VS:  BP 137/85 (BP Location: Left Arm, Patient Position: Sitting, Cuff Size: Normal)   Pulse 83   Resp 16   Ht 5' 2 (1.575 m)   Wt 154 lb 6.4 oz (70 kg)   SpO2 98%   BMI 28.24 kg/m     Wt Readings from Last 3 Encounters:  07/07/24 154 lb 6.4 oz (70 kg)  01/07/24 152 lb (68.9 kg)  12/24/23 155 lb 12.8 oz (70.7 kg)     GEN: Well nourished, well developed in no acute distress HEENT: Normal NECK: No JVD; No carotid bruits LYMPHATICS: No lymphadenopathy CARDIAC: RRR, no murmurs, rubs, gallops RESPIRATORY:  Clear to auscultation without rales, wheezing or rhonchi  ABDOMEN: Soft, non-tender, non-distended MUSCULOSKELETAL:  No edema; No deformity  SKIN: Warm and dry NEUROLOGIC:  Alert and oriented x 3 PSYCHIATRIC:  Normal affect   ASSESSMENT:    1. Permanent atrial fibrillation (HCC)   2. Aortic valve insufficiency, etiology of cardiac valve disease unspecified   3. Essential hypertension   4. Chronic diastolic heart failure (HCC)   5. Hyperlipidemia,  unspecified hyperlipidemia type   6. Tobacco use      PLAN:    Permanent atrial fibrillation: CHA2DS2-VASc score 3 (hypertension, age, female).  Echocardiogram in 04/25/2021 showed normal biventricular function, mild to moderate aortic regurgitation.  Underwent successful cardioversion on 06/01/2021.  Was in A-fib on follow-up 03/2022, appears has been in A-fib since that time, suspect permanent A-fib at this point -Eliquis  has been too expensive, switched to Xarelto .  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  Recommend referral to EP to evaluate for Watchman device -Continue toprol  XL 50 mg daily -Sleep study recommended but she declines at this time  Aortic regurgitation: Mild to moderate AI on echo 04/25/2021.  Mild on echocardiogram 12/2023, will monitor   Hypertension: On amlodipine  5 mg daily and telmisartan 80 mg daily.  Previously on HCTZ but discontinued due to hyponatremia  Chronic diastolic heart failure: On Lasix  20 mg daily as needed if gains more than 3 pounds in 1 day or 5 pounds in 1 week.  Appears euvolemic   Hyperlipidemia: On atorvastatin 20 mg daily.  LDL 59 on 12/05/2023  Tobacco use: Counseled on  risk of tobacco use and cessation strongly encouraged   RTC in 6 months   Medication Adjustments/Labs and Tests Ordered: Current medicines are reviewed at length with the patient today.  Concerns regarding medicines are outlined above.  Orders Placed This Encounter  Procedures   Ambulatory referral to Cardiac Electrophysiology    No orders of the defined types were placed in this encounter.    Patient Instructions  Medication Instructions:  Your physician recommends that you continue on your current medications as directed. Please refer to the Current Medication list given to you today.  *If you need a refill on your cardiac medications before your next appointment, please call your pharmacy*  Lab Work: NONE If you have labs (blood work)  drawn today and your tests are completely normal, you will receive your results only by: MyChart Message (if you have MyChart) OR A paper copy in the mail If you have any lab test that is abnormal or we need to change your  treatment, we will call you to review the results.  Testing/Procedures: NONE  Follow-Up: At [facility], you and your health needs are our priority.  As part of our continuing mission to provide you with exceptional heart care, our providers are all part of one team.  This team includes your primary Cardiologist (physician) and Advanced Practice Providers or APPs (Physician Assistants and Nurse Practitioners) who all work together to provide you with the care you need, when you need it.  Your next appointment:   6 month(s)  Provider:   Lonni Nanas, MD OR NP/PA   You have been referred to ELECTROPHYSIOLOGIST DR [facility] OR DR KENNYTH    We recommend signing up for the patient portal called MyChart.  Sign up information is provided on this After Visit Summary.  MyChart is used to connect with patients for Virtual Visits (Telemedicine).  Patients are able to view lab/test results, encounter notes, upcoming appointments, etc.  Non-urgent messages can be sent to your provider as well.   To learn more about what you can do with MyChart, go to [email or web address].            Signed, Lonni LITTIE Nanas, MD  07/07/2024 5:22 PM    [facility]

## 2024-07-07 ENCOUNTER — Encounter (HOSPITAL_BASED_OUTPATIENT_CLINIC_OR_DEPARTMENT_OTHER): Payer: Self-pay | Admitting: Cardiology

## 2024-07-07 ENCOUNTER — Ambulatory Visit (HOSPITAL_BASED_OUTPATIENT_CLINIC_OR_DEPARTMENT_OTHER): Admitting: Cardiology

## 2024-07-07 VITALS — BP 137/85 | HR 83 | Resp 16 | Ht 62.0 in | Wt 154.4 lb

## 2024-07-07 DIAGNOSIS — I10 Essential (primary) hypertension: Secondary | ICD-10-CM

## 2024-07-07 DIAGNOSIS — I5032 Chronic diastolic (congestive) heart failure: Secondary | ICD-10-CM | POA: Diagnosis not present

## 2024-07-07 DIAGNOSIS — E785 Hyperlipidemia, unspecified: Secondary | ICD-10-CM

## 2024-07-07 DIAGNOSIS — Z720 Tobacco use: Secondary | ICD-10-CM

## 2024-07-07 DIAGNOSIS — I351 Nonrheumatic aortic (valve) insufficiency: Secondary | ICD-10-CM | POA: Diagnosis not present

## 2024-07-07 DIAGNOSIS — I4821 Permanent atrial fibrillation: Secondary | ICD-10-CM

## 2024-07-07 NOTE — Patient Instructions (Signed)
 Medication Instructions:  Your physician recommends that you continue on your current medications as directed. Please refer to the Current Medication list given to you today.  *If you need a refill on your cardiac medications before your next appointment, please call your pharmacy*  Lab Work: NONE If you have labs (blood work) drawn today and your tests are completely normal, you will receive your results only by: MyChart Message (if you have MyChart) OR A paper copy in the mail If you have any lab test that is abnormal or we need to change your treatment, we will call you to review the results.  Testing/Procedures: NONE  Follow-Up: At [facility], you and your health needs are our priority.  As part of our continuing mission to provide you with exceptional heart care, our providers are all part of one team.  This team includes your primary Cardiologist (physician) and Advanced Practice Providers or APPs (Physician Assistants and Nurse Practitioners) who all work together to provide you with the care you need, when you need it.  Your next appointment:   6 month(s)  Provider:   Lonni Nanas, MD OR NP/PA   You have been referred to ELECTROPHYSIOLOGIST DR [facility] OR DR KENNYTH    We recommend signing up for the patient portal called MyChart.  Sign up information is provided on this After Visit Summary.  MyChart is used to connect with patients for Virtual Visits (Telemedicine).  Patients are able to view lab/test results, encounter notes, upcoming appointments, etc.  Non-urgent messages can be sent to your provider as well.   To learn more about what you can do with MyChart, go to [email or web address].

## 2024-07-08 ENCOUNTER — Telehealth: Payer: Self-pay

## 2024-07-08 NOTE — Telephone Encounter (Signed)
 Annette Kelley

## 2024-07-16 ENCOUNTER — Encounter (HOSPITAL_BASED_OUTPATIENT_CLINIC_OR_DEPARTMENT_OTHER): Payer: Self-pay | Admitting: Cardiology

## 2024-07-17 ENCOUNTER — Ambulatory Visit: Admitting: Cardiology

## 2024-07-17 NOTE — Telephone Encounter (Signed)
 To prevent hematoma from reoccurring, I think best option is Watchman procedure and then coming off anticoagulation altogether.  Is her hematoma resolving?

## 2024-07-30 ENCOUNTER — Ambulatory Visit: Admitting: Cardiology

## 2024-08-26 ENCOUNTER — Ambulatory Visit: Admitting: Cardiology

## 2024-09-10 NOTE — Progress Notes (Signed)
 [address] - AMBULATORY [facility]  - FAMILY MEDICINE [facility] [address] [place] [address]    Date of service:  09/10/2024  Name:  CARMEL GARFIELD  Date of Birth:  [birth year]   SUBJECTIVE   Patient ID: YESENIA LOCURTO is a 73 y.o. (DOB August 04, 1951) female  Chief Complaint  Patient presents with  . Back Pain   Back Pain:  The patient was last seen 07/30/24. Patient complains of back pain x 2 weeks  The pain was related to no known injury.  10/10 in the beginning, 2/10 today intensity, and is located at the left lumbar area or across the lower back.   The pain is described as aching and throbbing and occurs all day.  Worsened by:  coughing and certain movements, twisting.  Relieved by:  acetaminophen  and heat.   Associated symptoms: no other symptoms.  Previous history of symptoms: the problem is long-standing.   Treatment efforts have included acetaminophen , muscle relaxer and heat, with mild relief.    Review of Systems  All other pertinent systems reviewed and are negative.  Social History[1]   The following portions of the patient's history were reviewed and updated as appropriate: allergies, current medications, past family history, past medical history, past social history, past surgical history and problem list.  OBJECTIVE   Vitals:   09/10/24 1346  BP: 124/80  BP Location: Left arm  Patient Position: Sitting  Pulse: 80  Temp: 97.1 F (36.2 C)  TempSrc: Temporal  SpO2: 97%  Weight: 67.6 kg (149 lb)  Height: 1.537 m (5' 0.5)    Body mass index is 28.62 kg/m. Constitutional: Alert - NAD. Appears well-developed and well nourished. Neuro/MSK: A&O x 3.  No focal back tenderness.  Full forward flexion.  Did not attempt rotation or extension secondary to pain.  Bilateral lower extremity strength 5/5.  Sensory intact light touch.  Normal gait.   ASSESSMENT/PLAN   Problem List Items Addressed  This Visit   None Visit Diagnoses       Lumbar pain    -  Primary   Relevant Medications   tiZANidine (ZANAFLEX) 2 mg tablet     Need for vaccination       Relevant Orders   Flu, High-Dose, Trivalent, PF IM (Completed)     Conservative treatments, if no improvement within the next 6 weeks or so let me know, sooner if symptoms worsen.  Risks, benefits, and alternatives of the medication(s) and treatment plan(s) were discussed, and she expressed understanding. No barriers to treatment identified in this visit.   Return if symptoms worsen or fail to improve.    Current Outpatient Medications  Medication Instructions  . amLODIPine  (NORVASC ) 5 mg, Daily  . atorvastatin (LIPITOR) 20 mg, oral, Daily  . fluticasone propionate 0.05 % crea Daily PRN  . furosemide  (LASIX ) 20 mg tablet   . metoprolol  succinate (TOPROL  XL) 50 mg, oral, Daily  . rivaroxaban  (XARELTO ) 20 mg  . telmisartan (MICARDIS) 80 mg, oral, Daily  . tiZANidine (ZANAFLEX) 2-4 mg, oral, Every 8 hours PRN    This document serves as a record of services personally performed by Madelin Brought, MD.  It was created on their behalf by Rolin LOISE Ka, CMA, a trained medical scribe, and Certified Medical Assistant (CMA). During the course of documenting the history, physical exam and medical decision making, I was functioning as a stage manager. The creation of this record is  the provider's dictation and/or activities during the visit.  Electronically signed by Rolin LOISE Ka, CMA 09/10/2024 8:49 AM    I agree the documentation is accurate and complete.  Electronically signed by: Tammy Rachel Brought, MD 09/10/2024 2:40 PM        [1] Social History Tobacco Use  . Smoking status: Every Day    Current packs/day: 0.50    Types: Cigarettes    Passive exposure: Never  . Smokeless tobacco: Never  Vaping Use  . Vaping status: Never Used  Substance Use Topics  . Alcohol use: Yes    Alcohol/week: 4.0 standard drinks of alcohol     Types: 2 Cans of beer, 2 Shots of liquor per week  . Drug use: No

## 2024-09-21 NOTE — Progress Notes (Unsigned)
 Electrophysiology Office Note:   Date:  09/24/2024  ID:  Annette Kelley, DOB 08-Oct-1951, MRN [id number]  Primary Cardiologist: Lonni LITTIE Nanas, MD Electrophysiologist: Fonda Kitty, MD      History of Present Illness:   Annette Kelley is a 73 y.o. female with h/o permanent atrial fibrillation, hypertension, hyperlipidemia, mild-mod AR, chronic diastolic heart failure who is being seen today for evaluation for Watchman device at the request of Dr. Nanas.  Discussed the use of AI scribe software for clinical note transcription with the patient, who gave verbal consent to proceed.  History of Present Illness Annette Kelley is a 73 year old female with atrial fibrillation who presents with a hematoma in the right thigh, possibly related to Xarelto  use. She was referred by Dr. Nanas for evaluation of her hematoma and potential replacement of Xarelto .  She developed a painful and tender hematoma in her right thigh, with discoloration extending down the leg. There was no trauma or falls preceding its appearance.  She has a history of atrial fibrillation and is currently on Xarelto  to reduce her stroke risk. However, she is experiencing increased bleeding risk, as evidenced by the spontaneous hematoma. The hematoma has been severe enough in the past to cause the entire back of her leg to turn black and purple. The current hematoma is described as 'real tender' and is located on the back side of her leg.  She is concerned about the cost and insurance coverage for potential procedures and medications. She is currently taking Xarelto . No falls or feeling unstable on her feet.  Review of systems complete and found to be negative unless listed in HPI.   EP Information / Studies Reviewed:    EKG is not ordered today. EKG from 11/28/23 reviewed which showed AF      Echo 12/24/23:   1. Left ventricular ejection fraction, by estimation, is 55 to 60%. The  left ventricle has normal  function. The left ventricle has no regional  wall motion abnormalities. Left ventricular diastolic function could not  be evaluated.   2. Right ventricular systolic function is normal. The right ventricular  size is normal. There is normal pulmonary artery systolic pressure. The  estimated right ventricular systolic pressure is 35.3 mmHg.   3. The mitral valve is grossly normal. Trivial mitral valve  regurgitation. No evidence of mitral stenosis.   4. The aortic valve is tricuspid. Aortic valve regurgitation is mild. No  aortic stenosis is present.   5. The inferior vena cava is normal in size with greater than 50%  respiratory variability, suggesting right atrial pressure of 3 mmHg.   Risk Assessment/Calculations:    CHA2DS2-VASc Score = 4   This indicates a 4.8% annual risk of stroke. The patient's score is based upon: CHF History: 1 HTN History: 1 Diabetes History: 0 Stroke History: 0 Vascular Disease History: 0 Age Score: 1 Gender Score: 1         Physical Exam:   VS:  BP 133/82   Pulse 92   Ht 5' 2 (1.575 m)   Wt 150 lb (68 kg)   SpO2 96%   BMI 27.44 kg/m    Wt Readings from Last 3 Encounters:  09/22/24 150 lb (68 kg)  07/07/24 154 lb 6.4 oz (70 kg)  01/07/24 152 lb (68.9 kg)     GEN: Well nourished, well developed in no acute distress NECK: No JVD CARDIAC: Normal rate, regular rhythm RESPIRATORY:  Clear to auscultation without rales, wheezing  or rhonchi  ABDOMEN: Soft, non-distended EXTREMITIES:  No edema; No deformity   ASSESSMENT AND PLAN:    I have seen Annette Kelley in the office today who is being considered for a Watchman left atrial appendage closure device. I believe they will benefit from this procedure given their history of atrial fibrillation, CHA2DS2-VASc score of 4 and unadjusted ischemic stroke rate of 4.8% per year. Unfortunately, the patient is not felt to be a long term anticoagulation candidate secondary to recurrent right lower  extremity hematoma. The patient's chart has been reviewed and I feel that they would be a candidate for short term oral anticoagulation after Watchman implant.   It is my belief that after undergoing a LAA closure procedure, Annette Kelley will not need long term anticoagulation which eliminates anticoagulation side effects and major bleeding risk.   Procedural risks for the Watchman implant have been reviewed with the patient including a 0.5% risk of stroke, <1% risk of perforation and <1% risk of device embolization. Other risks include bleeding, vascular damage, tamponade, worsening renal function, and death. The patient understands these risk and wishes to proceed.     The published clinical data on the safety and effectiveness of WATCHMAN include but are not limited to the following: - Holmes DR, Jess BEARD, Sick P et al. for the PROTECT AF Investigators. Percutaneous closure of the left atrial appendage versus warfarin therapy for prevention of stroke in patients with atrial fibrillation: a randomised non-inferiority trial. Lancet 2009; 374: 534-42. GLENWOOD Jess BEARD, Doshi SK, Jonita VEAR Satchel D et al. on behalf of the PROTECT AF Investigators. Percutaneous Left Atrial Appendage Closure for Stroke Prophylaxis in Patients With Atrial Fibrillation 2.3-Year Follow-up of the PROTECT AF (Watchman Left Atrial Appendage System for Embolic Protection in Patients With Atrial Fibrillation) Trial. Circulation 2013; 127:720-729. - Alli O, Doshi S,  Kar S, Reddy VY, Sievert H et al. Quality of Life Assessment in the Randomized PROTECT AF (Percutaneous Closure of the Left Atrial Appendage Versus Warfarin Therapy for Prevention of Stroke in Patients With Atrial Fibrillation) Trial of Patients at Risk for Stroke With Nonvalvular Atrial Fibrillation. J Am Coll Cardiol 2013; 61:1790-8. GLENWOOD Satchel DR, Archer RAMAN, Price M, Whisenant B, Sievert H, Doshi S, Huber K, Reddy V. Prospective randomized evaluation of the Watchman left  atrial appendage Device in patients with atrial fibrillation versus long-term warfarin therapy; the PREVAIL trial. Journal of the [employer] of Cardiology, Vol. 4, No. 1, 2014, 1-11. - Kar S, Doshi SK, Sadhu A, Horton R, Osorio J et al. Primary outcome evaluation of a next-generation left atrial appendage closure device: results from the PINNACLE FLX trial. Circulation 2021;143(18)1754-1762.    After today's visit with the patient which was dedicated solely for shared decision making visit regarding LAA closure device, the patient decided to proceed with the LAA appendage closure procedure scheduled to be done in the near future at [facility]. Prior to the procedure, I would like to obtain a gated CT scan of the chest with contrast timed for PV/LA visualization.    HAS-BLED score 3 Hypertension Yes  Abnormal renal and liver function (Dialysis, transplant, Cr >2.26 mg/dL /Cirrhosis or Bilirubin >2x Normal or AST/ALT/AP >3x Normal) No  Stroke No  Bleeding Yes  Labile INR (Unstable/high INR) No  Elderly (>65) Yes  Drugs or alcohol (>= 8 drinks/week, anti-plt or NSAID) No   CHA2DS2-VASc Score = 4  The patient's score is based upon: CHF History: 1 HTN History: 1 Diabetes  History: 0 Stroke History: 0 Vascular Disease History: 0 Age Score: 1 Gender Score: 1       ASSESSMENT AND PLAN: Permanent Atrial Fibrillation (ICD10:  I48.11) The patient's CHA2DS2-VASc score is 4, indicating a 4.8% annual risk of stroke.  Asymptomatic.  Continue metoprolol  XL 50 mg daily for rate control.  Secondary Hypercoagulable State (ICD10:  D68.69) The patient is at significant risk for stroke/thromboembolism based upon her CHA2DS2-VASc Score of 4.  Continue Rivaroxaban  (Xarelto ).   Follow up with Dr. Kennyth 3 months after [facility] implant.   Signed, Fonda Kennyth, MD

## 2024-09-22 ENCOUNTER — Encounter: Payer: Self-pay | Admitting: Cardiology

## 2024-09-22 ENCOUNTER — Ambulatory Visit: Attending: Cardiology | Admitting: Cardiology

## 2024-09-22 VITALS — BP 133/82 | HR 92 | Ht 62.0 in | Wt 150.0 lb

## 2024-09-22 DIAGNOSIS — D6869 Other thrombophilia: Secondary | ICD-10-CM | POA: Diagnosis not present

## 2024-09-22 DIAGNOSIS — S7010XS Contusion of unspecified thigh, sequela: Secondary | ICD-10-CM | POA: Diagnosis not present

## 2024-09-22 DIAGNOSIS — I4821 Permanent atrial fibrillation: Secondary | ICD-10-CM | POA: Diagnosis not present

## 2024-09-22 NOTE — Patient Instructions (Signed)
 Medication Instructions:  Your physician recommends that you continue on your current medications as directed. Please refer to the Current Medication list given to you today.  *If you need a refill on your cardiac medications before your next appointment, please call your pharmacy*   Testing/Procedures: Cardiac CT Your physician has requested that you have cardiac CT. Cardiac computed tomography (CT) is a painless test that uses an x-ray machine to take clear, detailed pictures of your heart. For further information please visit [email or web address] Please follow instruction sheet as given. You will be called to schedule this test.   Watchman  Your physician has requested that you have Left atrial appendage (LAA) closure device implantation is a procedure to put a small device in the LAA of the heart. The LAA is a small sac in the wall of the heart's left upper chamber. Blood clots can form in this area. The device, Watchman closes the LAA to help prevent a blood clot and stroke.  You will be contacted by Nurse Navigator, Rockie Redman to schedule your pre-procedure visit and procedure date. If you have any questions she can be reached at [phone].   Follow-Up: At [facility], you and your health needs are our priority.  As part of our continuing mission to provide you with exceptional heart care, our providers are all part of one team.  This team includes your primary Cardiologist (physician) and Advanced Practice Providers or APPs (Physician Assistants and Nurse Practitioners) who all work together to provide you with the care you need, when you need it.

## 2024-09-23 ENCOUNTER — Telehealth: Payer: Self-pay

## 2024-09-23 NOTE — Telephone Encounter (Signed)
 Watchman CT was scheduled for 11/14.

## 2024-10-02 ENCOUNTER — Ambulatory Visit (HOSPITAL_COMMUNITY)
Admission: RE | Admit: 2024-10-02 | Discharge: 2024-10-02 | Disposition: A | Discharge status: Home / Self Care | Source: Ambulatory Visit | Attending: Cardiology | Admitting: Cardiology

## 2024-10-02 DIAGNOSIS — D6869 Other thrombophilia: Secondary | ICD-10-CM | POA: Diagnosis present

## 2024-10-02 DIAGNOSIS — I4821 Permanent atrial fibrillation: Secondary | ICD-10-CM | POA: Diagnosis present

## 2024-10-02 MED ORDER — IOHEXOL 350 MG/ML SOLN
80.0000 mL | Freq: Once | INTRAVENOUS | Status: AC | PRN
  Administered 2024-10-02: 80 mL via INTRAVENOUS

## 2024-10-05 ENCOUNTER — Ambulatory Visit: Payer: Self-pay

## 2024-10-05 ENCOUNTER — Telehealth: Payer: Self-pay

## 2024-10-05 NOTE — Telephone Encounter (Signed)
 Chicken wing morphology. Distal filling defect noted but looks ok in delayed images. Max 24/ AVG 23/ Depth 15.8 Likely use a 27mm device Inf/Mid TSP RAO 15 CAU 17

## 2024-10-08 NOTE — Telephone Encounter (Signed)
 Called patient to arrange LAAO date. Patient wishes to speak with her insurance company first. She will return call with her decision.

## 2024-10-13 ENCOUNTER — Other Ambulatory Visit: Payer: Self-pay

## 2024-10-13 ENCOUNTER — Telehealth: Payer: Self-pay

## 2024-10-13 DIAGNOSIS — I4821 Permanent atrial fibrillation: Secondary | ICD-10-CM

## 2024-10-13 DIAGNOSIS — S7010XS Contusion of unspecified thigh, sequela: Secondary | ICD-10-CM

## 2024-10-13 NOTE — Telephone Encounter (Signed)
 SABRA

## 2024-10-13 NOTE — Telephone Encounter (Addendum)
 Patient returned call. Discussed LAAO dates. She would like to go 12/24/24 with Dr. Kennyth. Will schedule as fourth case [id number], report time of 1015.  Arranged pre-procedure OV with Charlies Arthur PA-C.

## 2024-11-09 NOTE — Telephone Encounter (Signed)
 Patient returned call. She would like to keep implant as scheduled 12/24/2024.

## 2024-11-09 NOTE — Telephone Encounter (Signed)
 Called patient to offer sooner Watchman date of 11/26/2024. She will speak with family and will call back to confirm.

## 2024-11-18 NOTE — Telephone Encounter (Signed)
 Due to scheduling change, patient's pre-op OV needs to be rescheduled.   Left message for her to return call.

## 2024-11-18 NOTE — Telephone Encounter (Signed)
 Patient returned call. Explained pre-op OV needs to be changed. Confirmed OV with Annette Kelley 12/15/24 at 8:20AM. Patient uses MyChart. Confirmed LAAO date of 12/24/24. Patient grateful for the call.

## 2024-11-25 ENCOUNTER — Ambulatory Visit: Admitting: Physician Assistant

## 2024-11-29 ENCOUNTER — Encounter (HOSPITAL_BASED_OUTPATIENT_CLINIC_OR_DEPARTMENT_OTHER): Payer: Self-pay | Admitting: Cardiology

## 2024-11-29 DIAGNOSIS — I10 Essential (primary) hypertension: Secondary | ICD-10-CM

## 2024-11-30 MED ORDER — AMLODIPINE BESYLATE 5 MG PO TABS: 5.0000 mg | ORAL_TABLET | Freq: Every day | ORAL | 1 refills | Status: DC

## 2024-12-03 ENCOUNTER — Other Ambulatory Visit: Payer: Self-pay | Admitting: Cardiology

## 2024-12-03 DIAGNOSIS — I10 Essential (primary) hypertension: Secondary | ICD-10-CM

## 2024-12-14 ENCOUNTER — Encounter: Payer: Self-pay | Admitting: Cardiology

## 2024-12-15 ENCOUNTER — Ambulatory Visit: Admitting: Student

## 2024-12-18 ENCOUNTER — Encounter: Payer: Self-pay | Admitting: Cardiology

## 2024-12-18 ENCOUNTER — Ambulatory Visit: Attending: Cardiology | Admitting: Cardiology

## 2024-12-18 VITALS — BP 128/78 | HR 84 | Ht 62.0 in | Wt 157.2 lb

## 2024-12-18 DIAGNOSIS — I4821 Permanent atrial fibrillation: Secondary | ICD-10-CM

## 2024-12-18 DIAGNOSIS — D6869 Other thrombophilia: Secondary | ICD-10-CM

## 2024-12-18 DIAGNOSIS — I10 Essential (primary) hypertension: Secondary | ICD-10-CM | POA: Diagnosis not present

## 2024-12-18 DIAGNOSIS — S7010XS Contusion of unspecified thigh, sequela: Secondary | ICD-10-CM | POA: Diagnosis not present

## 2024-12-18 LAB — BASIC METABOLIC PANEL WITH GFR
BUN/Creatinine Ratio: 16 (ref 12–28)
BUN: 14 mg/dL (ref 8–27)
CO2: 19 mmol/L — ABNORMAL LOW (ref 20–29)
Calcium: 10.1 mg/dL (ref 8.7–10.3)
Chloride: 103 mmol/L (ref 96–106)
Creatinine, Ser: 0.87 mg/dL (ref 0.57–1.00)
Glucose: 108 mg/dL — ABNORMAL HIGH (ref 70–99)
Potassium: 4.9 mmol/L (ref 3.5–5.2)
Sodium: 140 mmol/L (ref 134–144)
eGFR: 71 mL/min/{1.73_m2}

## 2024-12-18 LAB — CBC
Hematocrit: 49.5 % — ABNORMAL HIGH (ref 34.0–46.6)
Hemoglobin: 16.9 g/dL — ABNORMAL HIGH (ref 11.1–15.9)
MCH: 31.9 pg (ref 26.6–33.0)
MCHC: 34.1 g/dL (ref 31.5–35.7)
MCV: 94 fL (ref 79–97)
RBC: 5.29 x10E6/uL — ABNORMAL HIGH (ref 3.77–5.28)
RDW: 13 % (ref 11.7–15.4)
WBC: 9.6 10*3/uL (ref 3.4–10.8)

## 2024-12-18 NOTE — Patient Instructions (Signed)
 Medication Instructions:  Your physician recommends that you continue on your current medications as directed. Please refer to the Current Medication list given to you today.  *If you need a refill on your cardiac medications before your next appointment, please call your pharmacy*  Lab Work: TODAY: BMET and CBC  Testing/Procedures: Watchman  Your physician has requested that you have Left atrial appendage (LAA) closure device implantation is a procedure to put a small device in the LAA of the heart. The LAA is a small sac in the wall of the heart's left upper chamber. Blood clots can form in this area. The device, Watchman closes the LAA to help prevent a blood clot and stroke.  See instruction letter provided to you today.  Follow-Up: At [facility], you and your health needs are our priority.  As part of our continuing mission to provide you with exceptional heart care, our providers are all part of one team.  This team includes your primary Cardiologist (physician) and Advanced Practice Providers or APPs (Physician Assistants and Nurse Practitioners) who all work together to provide you with the care you need, when you need it.  Your next appointment:   3/24 at 9:40am

## 2024-12-19 ENCOUNTER — Other Ambulatory Visit (HOSPITAL_BASED_OUTPATIENT_CLINIC_OR_DEPARTMENT_OTHER): Payer: Self-pay | Admitting: Cardiology

## 2024-12-19 NOTE — Progress Notes (Signed)
 " Electrophysiology Office Note:   Date:  12/19/2024  ID:  Annette Kelley, DOB Sep 13, 1951, MRN [id number]  Primary Cardiologist: Lonni LITTIE Nanas, MD Electrophysiologist: Fonda Kitty, MD      History of Present Illness:   Annette Kelley is a 74 y.o. female with h/o permanent atrial fibrillation, hypertension, hyperlipidemia, mild-mod AR, chronic diastolic heart failure who is being seen today for evaluation for Watchman device at the request of Dr. Nanas.  Discussed the use of AI scribe software for clinical note transcription with the patient, who gave verbal consent to proceed.  History of Present Illness Annette Kelley is a 74 year old female with atrial fibrillation who was referred by Dr. Nanas for evaluation of her hematoma and potential replacement of Xarelto .  She developed a painful and tender hematoma in her right thigh, with discoloration extending down the leg. There was no trauma or falls preceding its appearance.  Annette Kelley is a 74 year old female with atrial fibrillation who presents today for a pre-procedural consultation regarding the Watchman procedure.  She is scheduled for the Watchman procedure on February 5th. Her current medication regimen includes Xarelto , taken at night, and a statin.  She expressed concerns about post-procedure logistics, including the need for someone to stay with her overnight due to her dog, which her cousin will handle. She also mentioned her role in taking her daughter to appointments, which may be affected by her recovery period.  Review of systems complete and found to be negative unless listed in HPI.   EP Information / Studies Reviewed:    EKG is not ordered today. EKG from 11/28/23 reviewed which showed AF      Echo 12/24/23:   1. Left ventricular ejection fraction, by estimation, is 55 to 60%. The  left ventricle has normal function. The left ventricle has no regional  wall motion abnormalities. Left ventricular  diastolic function could not  be evaluated.   2. Right ventricular systolic function is normal. The right ventricular  size is normal. There is normal pulmonary artery systolic pressure. The  estimated right ventricular systolic pressure is 35.3 mmHg.   3. The mitral valve is grossly normal. Trivial mitral valve  regurgitation. No evidence of mitral stenosis.   4. The aortic valve is tricuspid. Aortic valve regurgitation is mild. No  aortic stenosis is present.   5. The inferior vena cava is normal in size with greater than 50%  respiratory variability, suggesting right atrial pressure of 3 mmHg.   Risk Assessment/Calculations:    CHA2DS2-VASc Score = 4   This indicates a 4.8% annual risk of stroke. The patient's score is based upon: CHF History: 1 HTN History: 1 Diabetes History: 0 Stroke History: 0 Vascular Disease History: 0 Age Score: 1 Gender Score: 1         Physical Exam:   VS:  BP 128/78   Pulse 84   Ht 5' 2 (1.575 m)   Wt 157 lb 3.2 oz (71.3 kg)   SpO2 97%   BMI 28.75 kg/m    Wt Readings from Last 3 Encounters:  12/18/24 157 lb 3.2 oz (71.3 kg)  09/22/24 150 lb (68 kg)  07/07/24 154 lb 6.4 oz (70 kg)     GEN: Well nourished, well developed in no acute distress NECK: No JVD CARDIAC: Normal rate, regular rhythm RESPIRATORY:  Clear to auscultation without rales, wheezing or rhonchi  ABDOMEN: Soft, non-distended EXTREMITIES:  No edema; No deformity   ASSESSMENT AND  PLAN:    I have seen Annette Kelley in the office today who is being considered for a Watchman left atrial appendage closure device. I believe they will benefit from this procedure given their history of atrial fibrillation, CHA2DS2-VASc score of 4 and unadjusted ischemic stroke rate of 4.8% per year. Unfortunately, the patient is not felt to be a long term anticoagulation candidate secondary to recurrent right lower extremity hematoma. The patient's chart has been reviewed and I feel that they  would be a candidate for short term oral anticoagulation after Watchman implant.   It is my belief that after undergoing a LAA closure procedure, Annette Kelley will not need long term anticoagulation which eliminates anticoagulation side effects and major bleeding risk.   Procedural risks for the Watchman implant have been reviewed with the patient including a 0.5% risk of stroke, <1% risk of perforation and <1% risk of device embolization. Other risks include bleeding, vascular damage, tamponade, worsening renal function, and death. The patient understands these risk and wishes to proceed.     The published clinical data on the safety and effectiveness of WATCHMAN include but are not limited to the following: - Holmes DR, Jess BEARD, Sick P et al. for the PROTECT AF Investigators. Percutaneous closure of the left atrial appendage versus warfarin therapy for prevention of stroke in patients with atrial fibrillation: a randomised non-inferiority trial. Lancet 2009; 374: 534-42. GLENWOOD Jess BEARD, Doshi SK, Jonita VEAR Satchel D et al. on behalf of the PROTECT AF Investigators. Percutaneous Left Atrial Appendage Closure for Stroke Prophylaxis in Patients With Atrial Fibrillation 2.3-Year Follow-up of the PROTECT AF (Watchman Left Atrial Appendage System for Embolic Protection in Patients With Atrial Fibrillation) Trial. Circulation 2013; 127:720-729. - Alli O, Doshi S,  Kar S, Reddy VY, Sievert H et al. Quality of Life Assessment in the Randomized PROTECT AF (Percutaneous Closure of the Left Atrial Appendage Versus Warfarin Therapy for Prevention of Stroke in Patients With Atrial Fibrillation) Trial of Patients at Risk for Stroke With Nonvalvular Atrial Fibrillation. J Am Coll Cardiol 2013; 61:1790-8. GLENWOOD Satchel DR, Archer RAMAN, Price M, Whisenant B, Sievert H, Doshi S, Huber K, Reddy V. Prospective randomized evaluation of the Watchman left atrial appendage Device in patients with atrial fibrillation versus long-term  warfarin therapy; the PREVAIL trial. Journal of the [employer] of Cardiology, Vol. 4, No. 1, 2014, 1-11. - Kar S, Doshi SK, Sadhu A, Horton R, Osorio J et al. Primary outcome evaluation of a next-generation left atrial appendage closure device: results from the PINNACLE FLX trial. Circulation 2021;143(18)1754-1762.     HAS-BLED score 3 Hypertension Yes  Abnormal renal and liver function (Dialysis, transplant, Cr >2.26 mg/dL /Cirrhosis or Bilirubin >2x Normal or AST/ALT/AP >3x Normal) No  Stroke No  Bleeding Yes  Labile INR (Unstable/high INR) No  Elderly (>65) Yes  Drugs or alcohol (>= 8 drinks/week, anti-plt or NSAID) No   CHA2DS2-VASc Score = 4  The patient's score is based upon: CHF History: 1 HTN History: 1 Diabetes History: 0 Stroke History: 0 Vascular Disease History: 0 Age Score: 1 Gender Score: 1       ASSESSMENT AND PLAN: Permanent Atrial Fibrillation (ICD10:  I48.11) The patient's CHA2DS2-VASc score is 4, indicating a 4.8% annual risk of stroke.  Asymptomatic.  Continue metoprolol  XL 50 mg daily for rate control.  Secondary Hypercoagulable State (ICD10:  D68.69) The patient is at significant risk for stroke/thromboembolism based upon her CHA2DS2-VASc Score of 4.  Continue Rivaroxaban  (Xarelto ).  Follow up with Dr. Kennyth 3 months after [facility] implant.   Signed, Fonda Kennyth, MD  "

## 2024-12-19 NOTE — H&P (View-Only) (Signed)
 " Electrophysiology Office Note:   Date:  12/19/2024  ID:  Annette Kelley, DOB Sep 13, 1951, MRN [id number]  Primary Cardiologist: Lonni LITTIE Nanas, MD Electrophysiologist: Fonda Kitty, MD      History of Present Illness:   Annette Kelley is a 74 y.o. female with h/o permanent atrial fibrillation, hypertension, hyperlipidemia, mild-mod AR, chronic diastolic heart failure who is being seen today for evaluation for Watchman device at the request of Dr. Nanas.  Discussed the use of AI scribe software for clinical note transcription with the patient, who gave verbal consent to proceed.  History of Present Illness Annette Kelley is a 74 year old female with atrial fibrillation who was referred by Dr. Nanas for evaluation of her hematoma and potential replacement of Xarelto .  She developed a painful and tender hematoma in her right thigh, with discoloration extending down the leg. There was no trauma or falls preceding its appearance.  Annette Kelley is a 74 year old female with atrial fibrillation who presents today for a pre-procedural consultation regarding the Watchman procedure.  She is scheduled for the Watchman procedure on February 5th. Her current medication regimen includes Xarelto , taken at night, and a statin.  She expressed concerns about post-procedure logistics, including the need for someone to stay with her overnight due to her dog, which her cousin will handle. She also mentioned her role in taking her daughter to appointments, which may be affected by her recovery period.  Review of systems complete and found to be negative unless listed in HPI.   EP Information / Studies Reviewed:    EKG is not ordered today. EKG from 11/28/23 reviewed which showed AF      Echo 12/24/23:   1. Left ventricular ejection fraction, by estimation, is 55 to 60%. The  left ventricle has normal function. The left ventricle has no regional  wall motion abnormalities. Left ventricular  diastolic function could not  be evaluated.   2. Right ventricular systolic function is normal. The right ventricular  size is normal. There is normal pulmonary artery systolic pressure. The  estimated right ventricular systolic pressure is 35.3 mmHg.   3. The mitral valve is grossly normal. Trivial mitral valve  regurgitation. No evidence of mitral stenosis.   4. The aortic valve is tricuspid. Aortic valve regurgitation is mild. No  aortic stenosis is present.   5. The inferior vena cava is normal in size with greater than 50%  respiratory variability, suggesting right atrial pressure of 3 mmHg.   Risk Assessment/Calculations:    CHA2DS2-VASc Score = 4   This indicates a 4.8% annual risk of stroke. The patient's score is based upon: CHF History: 1 HTN History: 1 Diabetes History: 0 Stroke History: 0 Vascular Disease History: 0 Age Score: 1 Gender Score: 1         Physical Exam:   VS:  BP 128/78   Pulse 84   Ht 5' 2 (1.575 m)   Wt 157 lb 3.2 oz (71.3 kg)   SpO2 97%   BMI 28.75 kg/m    Wt Readings from Last 3 Encounters:  12/18/24 157 lb 3.2 oz (71.3 kg)  09/22/24 150 lb (68 kg)  07/07/24 154 lb 6.4 oz (70 kg)     GEN: Well nourished, well developed in no acute distress NECK: No JVD CARDIAC: Normal rate, regular rhythm RESPIRATORY:  Clear to auscultation without rales, wheezing or rhonchi  ABDOMEN: Soft, non-distended EXTREMITIES:  No edema; No deformity   ASSESSMENT AND  PLAN:    I have seen Annette Kelley in the office today who is being considered for a Watchman left atrial appendage closure device. I believe they will benefit from this procedure given their history of atrial fibrillation, CHA2DS2-VASc score of 4 and unadjusted ischemic stroke rate of 4.8% per year. Unfortunately, the patient is not felt to be a long term anticoagulation candidate secondary to recurrent right lower extremity hematoma. The patient's chart has been reviewed and I feel that they  would be a candidate for short term oral anticoagulation after Watchman implant.   It is my belief that after undergoing a LAA closure procedure, Annette Kelley will not need long term anticoagulation which eliminates anticoagulation side effects and major bleeding risk.   Procedural risks for the Watchman implant have been reviewed with the patient including a 0.5% risk of stroke, <1% risk of perforation and <1% risk of device embolization. Other risks include bleeding, vascular damage, tamponade, worsening renal function, and death. The patient understands these risk and wishes to proceed.     The published clinical data on the safety and effectiveness of WATCHMAN include but are not limited to the following: - Holmes DR, Jess BEARD, Sick P et al. for the PROTECT AF Investigators. Percutaneous closure of the left atrial appendage versus warfarin therapy for prevention of stroke in patients with atrial fibrillation: a randomised non-inferiority trial. Lancet 2009; 374: 534-42. GLENWOOD Jess BEARD, Doshi SK, Jonita VEAR Satchel D et al. on behalf of the PROTECT AF Investigators. Percutaneous Left Atrial Appendage Closure for Stroke Prophylaxis in Patients With Atrial Fibrillation 2.3-Year Follow-up of the PROTECT AF (Watchman Left Atrial Appendage System for Embolic Protection in Patients With Atrial Fibrillation) Trial. Circulation 2013; 127:720-729. - Alli O, Doshi S,  Kar S, Reddy VY, Sievert H et al. Quality of Life Assessment in the Randomized PROTECT AF (Percutaneous Closure of the Left Atrial Appendage Versus Warfarin Therapy for Prevention of Stroke in Patients With Atrial Fibrillation) Trial of Patients at Risk for Stroke With Nonvalvular Atrial Fibrillation. J Am Coll Cardiol 2013; 61:1790-8. GLENWOOD Satchel DR, Archer RAMAN, Price M, Whisenant B, Sievert H, Doshi S, Huber K, Reddy V. Prospective randomized evaluation of the Watchman left atrial appendage Device in patients with atrial fibrillation versus long-term  warfarin therapy; the PREVAIL trial. Journal of the [employer] of Cardiology, Vol. 4, No. 1, 2014, 1-11. - Kar S, Doshi SK, Sadhu A, Horton R, Osorio J et al. Primary outcome evaluation of a next-generation left atrial appendage closure device: results from the PINNACLE FLX trial. Circulation 2021;143(18)1754-1762.     HAS-BLED score 3 Hypertension Yes  Abnormal renal and liver function (Dialysis, transplant, Cr >2.26 mg/dL /Cirrhosis or Bilirubin >2x Normal or AST/ALT/AP >3x Normal) No  Stroke No  Bleeding Yes  Labile INR (Unstable/high INR) No  Elderly (>65) Yes  Drugs or alcohol (>= 8 drinks/week, anti-plt or NSAID) No   CHA2DS2-VASc Score = 4  The patient's score is based upon: CHF History: 1 HTN History: 1 Diabetes History: 0 Stroke History: 0 Vascular Disease History: 0 Age Score: 1 Gender Score: 1       ASSESSMENT AND PLAN: Permanent Atrial Fibrillation (ICD10:  I48.11) The patient's CHA2DS2-VASc score is 4, indicating a 4.8% annual risk of stroke.  Asymptomatic.  Continue metoprolol  XL 50 mg daily for rate control.  Secondary Hypercoagulable State (ICD10:  D68.69) The patient is at significant risk for stroke/thromboembolism based upon her CHA2DS2-VASc Score of 4.  Continue Rivaroxaban  (Xarelto ).  Follow up with Dr. Kennyth 3 months after [facility] implant.   Signed, Fonda Kennyth, MD  "

## 2024-12-20 ENCOUNTER — Ambulatory Visit: Payer: Self-pay | Admitting: Cardiology

## 2024-12-21 ENCOUNTER — Encounter: Payer: Self-pay | Admitting: Cardiology

## 2024-12-21 ENCOUNTER — Telehealth: Payer: Self-pay

## 2024-12-21 NOTE — Telephone Encounter (Signed)
 Patient scheduled for Watchman implant with Dr. Kennyth.  Confirmed procedure date of 12/24/2024. Confirmed arrival time of 0830 for procedure time at 1100. Reviewed pre-procedure instructions with patient. Contrast allergy? No PPM or defibrillator? No The patient understands to call if questions/concerns arise prior to procedure. The patient was grateful for call and agreed with plan.

## 2024-12-22 ENCOUNTER — Encounter (HOSPITAL_COMMUNITY): Payer: Self-pay | Admitting: Cardiology

## 2024-12-24 ENCOUNTER — Inpatient Hospital Stay (HOSPITAL_COMMUNITY)

## 2024-12-24 ENCOUNTER — Other Ambulatory Visit: Payer: Self-pay

## 2024-12-24 ENCOUNTER — Inpatient Hospital Stay (HOSPITAL_COMMUNITY): Admitting: Anesthesiology

## 2024-12-24 ENCOUNTER — Inpatient Hospital Stay (HOSPITAL_COMMUNITY)
Admission: RE | Admit: 2024-12-24 | Discharge: 2024-12-25 | Disposition: A | Source: Home / Self Care | Attending: Cardiology | Admitting: Cardiology

## 2024-12-24 ENCOUNTER — Encounter (HOSPITAL_COMMUNITY): Payer: Self-pay | Admitting: Cardiology

## 2024-12-24 ENCOUNTER — Encounter (HOSPITAL_COMMUNITY): Admission: RE | Payer: Self-pay | Source: Home / Self Care

## 2024-12-24 DIAGNOSIS — S7010XS Contusion of unspecified thigh, sequela: Secondary | ICD-10-CM

## 2024-12-24 DIAGNOSIS — Z95818 Presence of other cardiac implants and grafts: Secondary | ICD-10-CM | POA: Insufficient documentation

## 2024-12-24 DIAGNOSIS — I4821 Permanent atrial fibrillation: Principal | ICD-10-CM | POA: Diagnosis present

## 2024-12-24 HISTORY — DX: Cardiac arrhythmia, unspecified: I49.9

## 2024-12-24 LAB — CBC
HCT: 43.8 % (ref 36.0–46.0)
Hemoglobin: 15.4 g/dL — ABNORMAL HIGH (ref 12.0–15.0)
MCH: 32.3 pg (ref 26.0–34.0)
MCHC: 35.2 g/dL (ref 30.0–36.0)
MCV: 91.8 fL (ref 80.0–100.0)
Platelets: UNDETERMINED 10*3/uL (ref 150–400)
RBC: 4.77 MIL/uL (ref 3.87–5.11)
RDW: 12.9 % (ref 11.5–15.5)
WBC: 9.3 10*3/uL (ref 4.0–10.5)
nRBC: 0 % (ref 0.0–0.2)

## 2024-12-24 LAB — SURGICAL PCR SCREEN
MRSA, PCR: NEGATIVE
Staphylococcus aureus: NEGATIVE

## 2024-12-24 LAB — PLATELET COUNT: Platelets: ADEQUATE 10*3/uL (ref 150–400)

## 2024-12-24 LAB — TYPE AND SCREEN
ABO/RH(D): O POS
Antibody Screen: NEGATIVE

## 2024-12-24 LAB — ECHO TEE

## 2024-12-24 LAB — ABO/RH: ABO/RH(D): O POS

## 2024-12-24 MED ORDER — ONDANSETRON HCL 4 MG/2ML IJ SOLN
INTRAMUSCULAR | Status: DC | PRN
  Administered 2024-12-24: 4 mg via INTRAVENOUS

## 2024-12-24 MED ORDER — ONDANSETRON HCL 4 MG/2ML IJ SOLN: 4.0000 mg | Freq: Four times a day (QID) | INTRAMUSCULAR | Status: DC | PRN

## 2024-12-24 MED ORDER — HEPARIN (PORCINE) IN NACL 1000-0.9 UT/500ML-% IV SOLN
INTRAVENOUS | Status: DC | PRN
  Administered 2024-12-24 (×2): 500 mL

## 2024-12-24 MED ORDER — FENTANYL CITRATE (PF) 100 MCG/2ML IJ SOLN
INTRAMUSCULAR | Status: AC
  Filled 2024-12-24: qty 2

## 2024-12-24 MED ORDER — FENTANYL CITRATE (PF) 250 MCG/5ML IJ SOLN
INTRAMUSCULAR | Status: DC | PRN
  Administered 2024-12-24: 100 ug via INTRAVENOUS

## 2024-12-24 MED ORDER — ROCURONIUM BROMIDE 10 MG/ML (PF) SYRINGE
PREFILLED_SYRINGE | INTRAVENOUS | Status: DC | PRN
  Administered 2024-12-24: 70 mg via INTRAVENOUS

## 2024-12-24 MED ORDER — SODIUM CHLORIDE 0.9% FLUSH
3.0000 mL | Freq: Two times a day (BID) | INTRAVENOUS | Status: DC
  Administered 2024-12-24 – 2024-12-25 (×2): 3 mL via INTRAVENOUS

## 2024-12-24 MED ORDER — SODIUM CHLORIDE 0.9 % IV SOLN: 250.0000 mL | INTRAVENOUS | Status: DC | PRN

## 2024-12-24 MED ORDER — DEXAMETHASONE SOD PHOSPHATE PF 10 MG/ML IJ SOLN
INTRAMUSCULAR | Status: DC | PRN
  Administered 2024-12-24: 5 mg via INTRAVENOUS

## 2024-12-24 MED ORDER — HEPARIN SODIUM (PORCINE) 1000 UNIT/ML IJ SOLN
INTRAMUSCULAR | Status: DC | PRN
  Administered 2024-12-24: 13000 [IU] via INTRAVENOUS

## 2024-12-24 MED ORDER — SODIUM CHLORIDE 0.9 % IV SOLN: INTRAVENOUS | Status: DC

## 2024-12-24 MED ORDER — LIDOCAINE 2% (20 MG/ML) 5 ML SYRINGE
INTRAMUSCULAR | Status: DC | PRN
  Administered 2024-12-24: 100 mg via INTRAVENOUS

## 2024-12-24 MED ORDER — IOHEXOL 350 MG/ML SOLN
INTRAVENOUS | Status: DC | PRN
  Administered 2024-12-24: 15 mL

## 2024-12-24 MED ORDER — AMLODIPINE BESYLATE 5 MG PO TABS: 5.0000 mg | ORAL_TABLET | Freq: Every day | ORAL | Status: DC

## 2024-12-24 MED ORDER — PROPOFOL 10 MG/ML IV BOLUS
INTRAVENOUS | Status: DC | PRN
  Administered 2024-12-24: 100 mg via INTRAVENOUS

## 2024-12-24 MED ORDER — PHENYLEPHRINE 80 MCG/ML (10ML) SYRINGE FOR IV PUSH (FOR BLOOD PRESSURE SUPPORT)
PREFILLED_SYRINGE | INTRAVENOUS | Status: DC | PRN
  Administered 2024-12-24: 80 ug via INTRAVENOUS

## 2024-12-24 MED ORDER — ACETAMINOPHEN 325 MG PO TABS: 650.0000 mg | ORAL_TABLET | ORAL | Status: DC | PRN

## 2024-12-24 MED ORDER — PHENYLEPHRINE HCL-NACL 20-0.9 MG/250ML-% IV SOLN
INTRAVENOUS | Status: DC | PRN
  Administered 2024-12-24: 20 ug/min via INTRAVENOUS

## 2024-12-24 MED ORDER — RIVAROXABAN 20 MG PO TABS
20.0000 mg | ORAL_TABLET | Freq: Every day | ORAL | Status: DC
  Administered 2024-12-24: 20 mg via ORAL
  Filled 2024-12-24: qty 1

## 2024-12-24 MED ORDER — SUGAMMADEX SODIUM 200 MG/2ML IV SOLN
INTRAVENOUS | Status: DC | PRN
  Administered 2024-12-24: 200 mg via INTRAVENOUS

## 2024-12-24 MED ORDER — SODIUM CHLORIDE 0.9% FLUSH: 3.0000 mL | INTRAVENOUS | Status: DC | PRN

## 2024-12-24 MED ORDER — CHLORHEXIDINE GLUCONATE 0.12 % MT SOLN
OROMUCOSAL | Status: AC
  Administered 2024-12-24: 15 mL via OROMUCOSAL
  Filled 2024-12-24: qty 15

## 2024-12-24 MED ORDER — CHLORHEXIDINE GLUCONATE 4 % EX SOLN
Freq: Once | CUTANEOUS | Status: DC
  Filled 2024-12-24: qty 15

## 2024-12-24 MED ORDER — PROTAMINE SULFATE 10 MG/ML IV SOLN
INTRAVENOUS | Status: DC | PRN
  Administered 2024-12-24: 10 mg via INTRAVENOUS
  Administered 2024-12-24: 30 mg via INTRAVENOUS

## 2024-12-24 MED ORDER — METOPROLOL SUCCINATE ER 50 MG PO TB24: 50.0000 mg | ORAL_TABLET | Freq: Every day | ORAL | Status: DC

## 2024-12-24 MED ORDER — CEFAZOLIN SODIUM-DEXTROSE 2-4 GM/100ML-% IV SOLN
2.0000 g | INTRAVENOUS | Status: AC
  Administered 2024-12-24: 2 g via INTRAVENOUS
  Filled 2024-12-24: qty 100

## 2024-12-24 MED ORDER — CHLORHEXIDINE GLUCONATE 0.12 % MT SOLN: 15.0000 mL | Freq: Once | OROMUCOSAL | Status: AC

## 2024-12-24 MED ORDER — ATORVASTATIN CALCIUM 10 MG PO TABS
20.0000 mg | ORAL_TABLET | Freq: Every day | ORAL | Status: DC
  Administered 2024-12-24: 20 mg via ORAL
  Filled 2024-12-24: qty 2

## 2024-12-24 NOTE — Transfer of Care (Signed)
 Immediate Anesthesia Transfer of Care Note  Patient: Annette Kelley  Procedure(s) Performed: LEFT ATRIAL APPENDAGE OCCLUSION TRANSESOPHAGEAL ECHOCARDIOGRAM  Patient Location: PACU and Cath Lab  Anesthesia Type:General  Level of Consciousness: awake, alert , and oriented  Airway & Oxygen Therapy: Patient Spontanous Breathing  Post-op Assessment: Report given to RN and Post -op Vital signs reviewed and stable  Post vital signs: Reviewed and stable  Last Vitals:  Vitals Value Taken Time  BP    Temp    Pulse    Resp    SpO2      Last Pain:  Vitals:   12/24/24 0939  TempSrc:   PainSc: 0-No pain         Complications: There were no known notable events for this encounter.

## 2024-12-24 NOTE — Anesthesia Procedure Notes (Signed)
 Procedure Name: Intubation Date/Time: 12/24/2024 12:29 PM  Performed by: Mannie Krystal LABOR, CRNAPre-anesthesia Checklist: Patient identified, Emergency Drugs available, Suction available and Patient being monitored Patient Re-evaluated:Patient Re-evaluated prior to induction Oxygen Delivery Method: Circle system utilized Preoxygenation: Pre-oxygenation with 100% oxygen Induction Type: IV induction Ventilation: Mask ventilation without difficulty Laryngoscope Size: Miller and 2 Grade View: Grade I Tube type: Oral Tube size: 7.0 mm Number of attempts: 1 Airway Equipment and Method: Stylet Placement Confirmation: ETT inserted through vocal cords under direct vision, positive ETCO2 and breath sounds checked- equal and bilateral Secured at: 21 cm Tube secured with: Tape Dental Injury: Teeth and Oropharynx as per pre-operative assessment

## 2024-12-24 NOTE — Discharge Instructions (Signed)
 " WATCHMAN Procedure, Care After  Procedure MD: Dr. Kennyth Olds Clinical Coordinator: Edsel Greek, RN and Rockie Redman, RN  This sheet gives you information about how to care for yourself after your procedure. Your health care provider may also give you more specific instructions. If you have problems or questions, contact your health care provider.  What can I expect after the procedure? After the procedure, it is common to have: Bruising around your puncture site. Tenderness around your puncture site. Tiredness (fatigue).  Medication instructions It is very important to continue to take your blood thinner as directed by your doctor after the Watchman procedure. Call your procedure doctors office with question or concerns. If you are on Coumadin (warfarin), you will have your INR checked the week after your procedure, with a goal INR of 2.0 - 3.0. Please follow your medication instructions on your discharge summary. Only take the medications listed on your discharge paperwork.  Follow up You will be seen in 6 weeks after your procedure You will have a repeat CT scan or Echocardiogram approximately 8 weeks after your procedure mark to check your device You will follow up the MD/APP who performed your procedure 6 months after your procedure The Watchman Clinical Coordinator will check in with you from time to time, including 1 and 2 years after your procedure.  NO DENTAL CLEANINGS FOR 45 days. After that, you will require antibiotics for dental procedures the first 6 months.   Follow these instructions at home: Puncture site care  Follow instructions from your health care provider about how to take care of your puncture site. Make sure you: If present, leave stitches (sutures), skin glue, or adhesive strips in place.  If a large square bandage is present, this may be removed 24 hours after surgery.  Check your puncture site every day for signs of infection. Check for: Redness,  swelling, or pain. Fluid or blood. If your puncture site starts to bleed, lie down on your back, apply firm pressure to the area, and contact your health care provider. Warmth. Pus or a bad smell. Driving Do not drive yourself home if you received sedation Do not drive for at least 4 days after your procedure or however long your health care provider recommends. (Do not resume driving if you have previously been instructed not to drive for other health reasons.) Do not spend greater than 1 hour at a time in a car for the first 3 days. Stop and take a break with a 5 minute walk at least every hour.  Do not drive or use heavy machinery while taking prescription pain medicine.  Activity Avoid activities that take a lot of effort, including exercise, for at least 7 days after your procedure. For the first 3 days, avoid sitting for longer than one hour at a time.  Avoid alcoholic beverages, signing paperwork, or participating in legal proceedings for 24 hours after receiving sedation Do not lift anything that is heavier than 10 lb (4.5 kg) for one week.  No sexual activity for 1 week.  Return to your normal activities as told by your health care provider. Ask your health care provider what activities are safe for you. General instructions Take over-the-counter and prescription medicines only as told by your health care provider. Do not use any products that contain nicotine or tobacco, such as cigarettes and e-cigarettes. If you need help quitting, ask your health care provider. You may shower after 24 hours, but Do not take baths, swim,  or use a hot tub for 1 week.  Do not drink alcohol for 24 hours after your procedure. Keep all follow-up visits as told by your health care provider. This is important. Dental Work: You will require antibiotics prior to any dental work, including cleanings, for 6 months after your Watchman implantation to help protect you from infection. After 6 months, antibiotics  are no longer required. Contact a health care provider if: You have redness, mild swelling, or pain around your puncture site. You have soreness in your throat or at your puncture site that does not improve after several days You have fluid or blood coming from your puncture site that stops after applying firm pressure to the area. Your puncture site feels warm to the touch. You have pus or a bad smell coming from your puncture site. You have a fever. You have chest pain or discomfort that spreads to your neck, jaw, or arm. You are sweating a lot. You feel nauseous. You have a fast or irregular heartbeat. You have shortness of breath. You are dizzy or light-headed and feel the need to lie down. You have pain or numbness in the arm or leg closest to your puncture site. Get help right away if: Your puncture site suddenly swells. Your puncture site is bleeding and the bleeding does not stop after applying firm pressure to the area. These symptoms may represent a serious problem that is an emergency. Do not wait to see if the symptoms will go away. Get medical help right away. Call your local emergency services (911 in the [place] Do not drive yourself to the hospital. Summary After the procedure, it is normal to have bruising and tenderness at the puncture site in your groin, neck, or forearm. Check your puncture site every day for signs of infection. Get help right away if your puncture site is bleeding and the bleeding does not stop after applying firm pressure to the area. This is a medical emergency.  This information is not intended to replace advice given to you by your health care provider. Make sure you discuss any questions you have with your health care provider.   "

## 2024-12-24 NOTE — Discharge Summary (Incomplete)
 "   Electrophysiology Discharge Summary   Patient ID: Annette Kelley,  MRN: [id number], DOB/AGE: [id number] y.o.  Admit date: 12/24/2024 Discharge date: 12/25/2024  Primary Care Physician: Annette Madelin Patch, MD  Primary Cardiologist: Annette LITTIE Nanas, MD  Electrophysiologist: Annette Kitty, MD     Primary Discharge Diagnosis:  Permanent Atrial Fibrillation Secondary hypercoagulable state  Poor candidacy for long term anticoagulation due to spontaneous right thigh hematoma  Secondary Discharge Diagnosis:  HTN HLD  HFpEF  Mild-Moderate Aortic Regurgitation  Procedures This Admission:  Transeptal Puncture Intra-procedural TEE which showed no LAA thrombus Left atrial appendage occlusive device placement on 12/24/24 by Dr. Kitty     This study demonstrated: Successful implantation of a 27mm WATCHMAN left atrial appendage occlusive device.   2.  TEE demonstrating no LAA thrombus 3.  No early apparent complications.    Post Implant Anticoagulation Strategy: Continue Xarelto  for 45 days then transition to dual anti-platelet therapy with aspirin and clopidogrel. Repeat imaging with CT scan in 60 days.      Brief HPI: Annette Kelley is a 74 y.o. female with a history of Permanent Atrial Fibrillation who was referred to Electrophysiology in the outpatient setting    Hospital Course:  The patient was admitted and underwent left atrial appendage occlusive device placement as above.  The patient was monitored overnight and has done very well with no concerns. Groin site has been stable without evidence of hematoma or bleeding. Wound care and restrictions were reviewed with the patient.   The patient has been scheduled for post procedure follow up with EP APP in approximately 6 weeks. They will restart Xarelto  this evening and continue for 45 days then stop. At that time she will transition to Plavix 75mg  daily to complete 6 months of therapy. They will require dental SBE for 6  month post op and should refrain from dental work or cleanings for the first 45 days post implant. SBE to be RXd at follow up.   A repeat CT scan will be performed in approximately 60 days to ensure proper seal of the device.    Physical Exam: Vitals:   12/24/24 1950 12/24/24 2100 12/24/24 2148 12/25/24 0359  BP: 120/76  134/76 103/71  Pulse: (!) 103   (!) 105  Resp: 16   14  Temp: 98.5 F (36.9 C) 97.7 F (36.5 C)  97.7 F (36.5 C)  TempSrc: Oral   Oral  SpO2: 98%   98%  Weight:      Height:        GEN: Well nourished, well developed in no acute distress NECK: No JVD; No carotid bruits CARDIAC: Irregularly irregular rate and rhythm, no murmurs, rubs, gallops RESPIRATORY:  Clear to auscultation without rales, wheezing or rhonchi  ABDOMEN: Soft, non-tender, non-distended EXTREMITIES:  No edema; No deformity. Groin site Stable     Discharge Medications:  Allergies as of 12/25/2024       Reactions   Other Anaphylaxis   Cannot lick envelopes - feels like throat is closing    Sulfa Antibiotics Itching        Medication List     TAKE these medications    acetaminophen  325 MG tablet Commonly known as: TYLENOL  Take 2 tablets (650 mg total) by mouth every 4 (four) hours as needed for headache or mild pain (pain score 1-3).   amLODipine  5 MG tablet Commonly known as: NORVASC  TAKE 1 TABLET(5 MG) BY MOUTH DAILY   atorvastatin  20 MG tablet Commonly  known as: LIPITOR Take 20 mg by mouth at bedtime.   fluticasone 0.05 % cream Commonly known as: CUTIVATE Apply 1 Application topically daily as needed (Eczema).   furosemide  20 MG tablet Commonly known as: LASIX  TAKE 1 TABLET BY MOUTH DAILY AS NEEDED FOR WEIGHT GAIN OF 2 POUNDS IN 24 HOURS OR 5 POUNDS IN A WEEK   metoprolol  succinate 50 MG 24 hr tablet Commonly known as: TOPROL -XL Take 50 mg by mouth daily. Take with or immediately following a meal.   rivaroxaban  20 MG Tabs tablet Commonly known as: XARELTO  Take 1  tablet (20 mg total) by mouth daily with supper.   telmisartan 80 MG tablet Commonly known as: MICARDIS Take 80 mg by mouth daily.   tiZANidine 2 MG tablet Commonly known as: ZANAFLEX Take 2 mg by mouth every 8 (eight) hours as needed for muscle spasms.        Disposition:  Home with usual follow up as in AVS  Duration of Discharge Encounter:  APP Time: 24 minutes  Signed, Ozell Jodie Passey, PA-C  12/25/2024 8:27 AM     "

## 2024-12-24 NOTE — Anesthesia Preprocedure Evaluation (Addendum)
"                                    Anesthesia Evaluation  Patient identified by MRN, date of birth, ID band Patient awake    Reviewed: Allergy & Precautions, NPO status , Patient's Chart, lab work & pertinent test results, reviewed documented beta blocker date and time   History of Anesthesia Complications Negative for: history of anesthetic complications  Airway Mallampati: II  TM Distance: >3 FB     Dental no notable dental hx.    Pulmonary neg COPD, Current Smoker   breath sounds clear to auscultation       Cardiovascular hypertension, (-) CAD, (-) Past MI, (-) Cardiac Stents and (-) CABG + dysrhythmias Atrial Fibrillation  Rhythm:Regular Rate:Normal  IMPRESSIONS     1. Left ventricular ejection fraction, by estimation, is 55 to 60%. The  left ventricle has normal function. The left ventricle has no regional  wall motion abnormalities. Left ventricular diastolic function could not  be evaluated.   2. Right ventricular systolic function is normal. The right ventricular  size is normal. There is normal pulmonary artery systolic pressure. The  estimated right ventricular systolic pressure is 35.3 mmHg.   3. The mitral valve is grossly normal. Trivial mitral valve  regurgitation. No evidence of mitral stenosis.   4. The aortic valve is tricuspid. Aortic valve regurgitation is mild. No  aortic stenosis is present.   5. The inferior vena cava is normal in size with greater than 50%  respiratory variability, suggesting right atrial pressure of 3 mmHg.     Neuro/Psych neg Seizures  Neuromuscular disease    GI/Hepatic ,,,(+) neg Cirrhosis        Endo/Other  Hypothyroidism    Renal/GU Renal disease     Musculoskeletal   Abdominal   Peds  Hematology   Anesthesia Other Findings   Reproductive/Obstetrics                              Anesthesia Physical Anesthesia Plan  ASA: 3  Anesthesia Plan: General   Post-op Pain  Management:    Induction: Intravenous  PONV Risk Score and Plan: 1 and Ondansetron  and Dexamethasone   Airway Management Planned: Oral ETT  Additional Equipment: ClearSight  Intra-op Plan:   Post-operative Plan:   Informed Consent: I have reviewed the patients History and Physical, chart, labs and discussed the procedure including the risks, benefits and alternatives for the proposed anesthesia with the patient or authorized representative who has indicated his/her understanding and acceptance.     Dental advisory given  Plan Discussed with: CRNA  Anesthesia Plan Comments:         Anesthesia Quick Evaluation  "

## 2024-12-24 NOTE — Interval H&P Note (Signed)
 History and Physical Interval Note:  12/24/2024 11:53 AM  Annette Kelley  has presented today for surgery, with the diagnosis of atrial fibrillation.  The various methods of treatment have been discussed with the patient and family. After consideration of risks, benefits and other options for treatment, the patient has consented to  Procedures: LEFT ATRIAL APPENDAGE OCCLUSION (N/A) TRANSESOPHAGEAL ECHOCARDIOGRAM (N/A) as a surgical intervention.  The patient's history has been reviewed, patient examined, no change in status, stable for surgery.  I have reviewed the patient's chart and labs.  Questions were answered to the patient's satisfaction.     Fonda Kitty

## 2024-12-24 NOTE — Plan of Care (Signed)

## 2024-12-25 ENCOUNTER — Other Ambulatory Visit: Payer: Self-pay | Admitting: Cardiology

## 2024-12-25 LAB — POCT ACTIVATED CLOTTING TIME: Activated Clotting Time: 455 s

## 2024-12-25 MED ORDER — ACETAMINOPHEN 325 MG PO TABS: 650.0000 mg | ORAL_TABLET | ORAL | Status: AC | PRN

## 2024-12-25 NOTE — Anesthesia Postprocedure Evaluation (Signed)
"   Anesthesia Post Note  Patient: Annette Kelley  Procedure(s) Performed: LEFT ATRIAL APPENDAGE OCCLUSION TRANSESOPHAGEAL ECHOCARDIOGRAM     Patient location during evaluation: PACU Anesthesia Type: General Level of consciousness: awake and alert Pain management: pain level controlled Vital Signs Assessment: post-procedure vital signs reviewed and stable Respiratory status: spontaneous breathing, nonlabored ventilation, respiratory function stable and patient connected to nasal cannula oxygen Cardiovascular status: blood pressure returned to baseline and stable Postop Assessment: no apparent nausea or vomiting Anesthetic complications: no   There were no known notable events for this encounter.  Last Vitals:  Vitals:   12/24/24 2148 12/25/24 0359  BP: 134/76 103/71  Pulse:  (!) 105  Resp:  14  Temp:  36.5 C  SpO2:  98%    Last Pain:  Vitals:   12/25/24 0829  TempSrc:   PainSc: 0-No pain                 Lynwood MARLA Cornea      "

## 2024-12-25 NOTE — Plan of Care (Signed)

## 2024-12-25 NOTE — Progress Notes (Signed)
 Patient refused morning medications as she is being discharged. She states she will take her medications when she gets home. PA notified.

## 2025-02-09 ENCOUNTER — Ambulatory Visit: Admitting: Student

## 2025-02-19 ENCOUNTER — Other Ambulatory Visit (HOSPITAL_COMMUNITY)
